# Patient Record
Sex: Female | Born: 1965 | Race: Black or African American | Hispanic: No | Marital: Single | Smoking: Former smoker
Health system: Southern US, Community
[De-identification: ages and names within clinical notes are randomized; demographics above are authoritative.]

## PROBLEM LIST (undated history)

## (undated) DIAGNOSIS — M545 Low back pain, unspecified: Secondary | ICD-10-CM

## (undated) DIAGNOSIS — R200 Anesthesia of skin: Secondary | ICD-10-CM

## (undated) DIAGNOSIS — G709 Myoneural disorder, unspecified: Secondary | ICD-10-CM

## (undated) DIAGNOSIS — F410 Panic disorder [episodic paroxysmal anxiety] without agoraphobia: Secondary | ICD-10-CM

## (undated) DIAGNOSIS — G35D Multiple sclerosis, unspecified: Secondary | ICD-10-CM

## (undated) DIAGNOSIS — F32A Depression, unspecified: Secondary | ICD-10-CM

## (undated) DIAGNOSIS — E7800 Pure hypercholesterolemia, unspecified: Secondary | ICD-10-CM

## (undated) DIAGNOSIS — R011 Cardiac murmur, unspecified: Secondary | ICD-10-CM

## (undated) DIAGNOSIS — R202 Paresthesia of skin: Secondary | ICD-10-CM

## (undated) DIAGNOSIS — I491 Atrial premature depolarization: Secondary | ICD-10-CM

## (undated) DIAGNOSIS — F329 Major depressive disorder, single episode, unspecified: Secondary | ICD-10-CM

## (undated) DIAGNOSIS — G35 Multiple sclerosis: Secondary | ICD-10-CM

## (undated) HISTORY — DX: Depression, unspecified: F32.A

## (undated) HISTORY — DX: Cardiac murmur, unspecified: R01.1

## (undated) HISTORY — DX: Pure hypercholesterolemia, unspecified: E78.00

## (undated) HISTORY — DX: Paresthesia of skin: R20.2

## (undated) HISTORY — DX: Major depressive disorder, single episode, unspecified: F32.9

## (undated) HISTORY — DX: Multiple sclerosis: G35

## (undated) HISTORY — PX: OTHER SURGICAL HISTORY: SHX169

## (undated) HISTORY — DX: Multiple sclerosis, unspecified: G35.D

## (undated) HISTORY — DX: Atrial premature depolarization: I49.1

## (undated) HISTORY — DX: Anesthesia of skin: R20.0

## (undated) HISTORY — DX: Low back pain: M54.5

## (undated) HISTORY — DX: Low back pain, unspecified: M54.50

## (undated) HISTORY — DX: Myoneural disorder, unspecified: G70.9

---

## 1996-10-27 DIAGNOSIS — I491 Atrial premature depolarization: Secondary | ICD-10-CM

## 1996-10-27 HISTORY — DX: Atrial premature depolarization: I49.1

## 1998-09-03 ENCOUNTER — Inpatient Hospital Stay (HOSPITAL_COMMUNITY): Admission: RE | Admit: 1998-09-03 | Discharge: 1998-09-06 | Payer: Self-pay | Admitting: Gynecology

## 2003-01-13 ENCOUNTER — Emergency Department (HOSPITAL_COMMUNITY): Admission: EM | Admit: 2003-01-13 | Discharge: 2003-01-13 | Payer: Self-pay | Admitting: Emergency Medicine

## 2003-05-03 ENCOUNTER — Emergency Department (HOSPITAL_COMMUNITY): Admission: EM | Admit: 2003-05-03 | Discharge: 2003-05-03 | Payer: Self-pay | Admitting: Emergency Medicine

## 2004-05-11 ENCOUNTER — Emergency Department (HOSPITAL_COMMUNITY): Admission: EM | Admit: 2004-05-11 | Discharge: 2004-05-11 | Payer: Self-pay | Admitting: Family Medicine

## 2004-05-13 ENCOUNTER — Emergency Department (HOSPITAL_COMMUNITY): Admission: EM | Admit: 2004-05-13 | Discharge: 2004-05-14 | Payer: Self-pay | Admitting: Family Medicine

## 2004-11-06 ENCOUNTER — Emergency Department (HOSPITAL_COMMUNITY): Admission: EM | Admit: 2004-11-06 | Discharge: 2004-11-06 | Payer: Self-pay | Admitting: Emergency Medicine

## 2004-11-24 ENCOUNTER — Emergency Department (HOSPITAL_COMMUNITY): Admission: EM | Admit: 2004-11-24 | Discharge: 2004-11-24 | Payer: Self-pay | Admitting: Family Medicine

## 2004-12-04 ENCOUNTER — Other Ambulatory Visit: Admission: RE | Admit: 2004-12-04 | Discharge: 2004-12-04 | Payer: Self-pay | Admitting: Family Medicine

## 2005-09-06 ENCOUNTER — Emergency Department (HOSPITAL_COMMUNITY): Admission: EM | Admit: 2005-09-06 | Discharge: 2005-09-06 | Payer: Self-pay | Admitting: Emergency Medicine

## 2005-10-27 HISTORY — PX: ABDOMINAL HYSTERECTOMY: SHX81

## 2006-06-08 ENCOUNTER — Inpatient Hospital Stay (HOSPITAL_COMMUNITY): Admission: RE | Admit: 2006-06-08 | Discharge: 2006-06-11 | Payer: Self-pay | Admitting: Gynecology

## 2006-06-08 ENCOUNTER — Encounter (INDEPENDENT_AMBULATORY_CARE_PROVIDER_SITE_OTHER): Payer: Self-pay | Admitting: *Deleted

## 2008-01-03 ENCOUNTER — Emergency Department (HOSPITAL_COMMUNITY): Admission: EM | Admit: 2008-01-03 | Discharge: 2008-01-03 | Payer: Self-pay | Admitting: Family Medicine

## 2009-08-20 ENCOUNTER — Encounter: Admission: RE | Admit: 2009-08-20 | Discharge: 2009-08-20 | Payer: Self-pay | Admitting: Family Medicine

## 2010-10-15 ENCOUNTER — Encounter
Admission: RE | Admit: 2010-10-15 | Discharge: 2010-10-15 | Payer: Self-pay | Source: Home / Self Care | Attending: Family Medicine | Admitting: Family Medicine

## 2011-03-14 NOTE — H&P (Signed)
[name], [name] NO.:  [id number]   MEDICAL RECORD NO.:  [id number]           PATIENT TYPE:   LOCATION:                                 FACILITY:   PHYSICIAN:  Ivor Costa. Farrel Gobble, M.D.      DATE OF BIRTH:   DATE OF ADMISSION:  06/08/2006  DATE OF DISCHARGE:                                HISTORY & PHYSICAL   CHIEF COMPLAINT:  Symptomatic fibroid uterus.   HISTORY OF PRESENT ILLNESS:  The patient is a 45 year old G1, P1 who  presented to  our office in January of 2007, after not being seen here for a  number of years secondary to a move to [place], [place].  The patient  had been seen by a physician locally there.  At the point when the patient  presented, she had been complaining of dysfunctional bleeding.  She was  known to have a fibroid uterus and had a multiple myomectomy done in 1999.  The patient had not been sexually active.  She states in January she began  bleeding large clots, going through 18-20 maternity pads a day.  She also  reported some fatigue.  The patient had a TSH and prolactin which were  normal.  She had gonorrhea and chlamydia cultures which were similarly  normal.  The patient had a CBC, however, which was remarkable for a  hemoglobin of 4.7 and a hematocrit of 15.8.  Based on that, the patient was  informed and presented back to the office for a sonohystogram.  We did stop  her bleeding prior to the sonohystogram with Megace 40 mg twice a day.  Her  ultrasound showed as many nine fibroids, four or five of which were  appreciable size.  There was nothing, however, impinging upon the cavity  that looked like a polyp or a submucosal myoma.  The patient was kept on  Macrobid twice a day and then once her bleeding stopped, she was decreased  down to Macrobid 40 mg per day and started on iron with extra  supplementation in order to help build up her blood count for surgery.  At  this point, the patient has a rare period which is very  light and painless.  She presents now for definitive surgery.  She had a hemoglobin done on the  third which showed a hemoglobin of 14 and hematocrit of 42 and the patient  was without any complaints.   ALLERGIES:  SHELLFISH.   PAST OBSTETRIC/GYNECOLOGIC HISTORY:  As mentioned above.  The patient also  had a cesarean section in 1997.  She states her Pap smears have been normal.  She is not interested at all in a second conception.   PAST MEDICAL HISTORY:  Negative.   PAST SURGICAL HISTORY:  Cesarean section and myomectomy as mentioned above.   SOCIAL HISTORY:  Negative for alcohol, tobacco or caffeine.   FAMILY HISTORY:  Negative for gynecologic cancers.   PHYSICAL EXAMINATION:  GENERAL APPEARANCE:  She is a well-appearing female  in no acute distress.  LUNGS:  Clear.  CARDIOVASCULAR:  Regular rate.  ABDOMEN:  Obese,  soft and nontender.  GYN:  She has normal external genitalia.  The BUS is negative. The vagina is  pink and moist.  There is some discharge noted in the vault and a wet prep  is taken.  Bimanual exam is no cervical motion tenderness.  The uterus is  mobile and nontender but enlarged.  The adnexa were not palpable.   ASSESSMENT:  Symptomatic fibroid uterus with an episode of severe  menorrhagia that has now been controlled with Megace.  The patient will  remain on Megace up until surgery after which point she may discontinue it.  Hemoglobin and hematocrit are suitable for surgery and she will present now  for a total abdominal hysterectomy.  The patient is aware that there is a  possibility because of scarring, risks to damage and underlying bowel, and  therefore would be bowel prepped the day before.  In addition, she is aware  because of scarring, there is a chance that she may lose one or both of her  adnexa which was also agreeable to her.      Ivor Costa. Farrel Gobble, M.D.  Electronically Signed     THL/MEDQ  D:  05/29/2006  T:  05/29/2006  Job:  [id number]

## 2011-03-14 NOTE — Discharge Summary (Signed)
NAMEJOZLYNN, PLAIA            ACCOUNT NO.:  [id number]   MEDICAL RECORD NO.:  [id number]          PATIENT TYPE:  INP   LOCATION:  9307                          FACILITY:  [facility]   PHYSICIAN:  Ivor Costa. Farrel Gobble, M.D. DATE OF BIRTH:  1965-12-12   DATE OF ADMISSION:  06/08/2006  DATE OF DISCHARGE:  06/11/2006                                 DISCHARGE SUMMARY   PRINCIPAL DIAGNOSIS:  Menorrhagia with severe anemia.   PRINCIPAL PROCEDURE:  Total abdominal hysterectomy.   ADDITIONAL PROCEDURE:  Lysis of bowel adhesions.   NOTE:  Refer to the dictated H and P.   HOSPITAL COURSE:  The patient reported in the morning of 06/08/2006 and  underwent a total abdominal hysterectomy through a Pfannenstiel incision  with an estimated blood loss of approximately 500 mL, with findings of a  multifibroid uterus and a left ovarian cyst that ruptured intraoperatively.  There were bowel adhesions that were noted that were lysed, and there was a  small serosal injury that was repaired.  The patient was extubated in the  operating room and was transferred to the postoperative floor in due  fashion.  Her postoperative course was unremarkable.  Initially, the patient  was kept on clears because of the serosal injury.  However, she had active  bowel sounds on the postoperative check and had flatus by the first morning,  at which point her diet was advanced.  The patient was tolerating a regular  diet, ambulating without difficulty, no nausea or vomiting at the time of  discharge.  The patient was discharged home in stable condition.   POSTOPERATIVE LABORATORY DATA:  Her hemoglobin was 10, hematocrit was 29.1,  platelets 176 and a white count of 8.6.   DISCHARGE INSTRUCTIONS:  The patient was discharged home with instructions  to use over-the-counter Motrin as needed.  She was given Tylox  preoperatively for postoperative pain management.   FOLLOW UP:  She will see [place] back in the office in two  weeks.      Ivor Costa. Farrel Gobble, M.D.  Electronically Signed     THL/MEDQ  D:  06/11/2006  T:  06/11/2006  Job:  [id number]

## 2011-03-14 NOTE — Op Note (Signed)
NAMEBROOKLYNE, RADKE            ACCOUNT NO.:  [id number]   MEDICAL RECORD NO.:  [id number]          PATIENT TYPE:  INP   LOCATION:  9399                          FACILITY:  [facility]   PHYSICIAN:  Ivor Costa. Farrel Gobble, M.D. DATE OF BIRTH:  [birth year]   DATE OF PROCEDURE:  06/08/2006  DATE OF DISCHARGE:                                 OPERATIVE REPORT   PREOPERATIVE DIAGNOSES:  1. Menorrhagia  2. Fibroid uterus.  3. Severe anemia treated.   POSTOPERATIVE DIAGNOSES:  1. Menorrhagia  2. Fibroid uterus.  3. Severe anemia treated.  4. Bowel adhesions.   PROCEDURE:  Total abdominal hysterectomy.   SURGEON:  Ivor Costa. Farrel Gobble, M.D.   ASSISTANT:  Rande Brunt. Eda Paschal, M.D.   ANESTHESIA:  Spinal with some IV sedation.   IV FLUIDS:  1900 mL of lactated Ringer's.   ESTIMATED BLOOD LOSS:  500 mL.   URINE OUTPUT:  250 mL clear urine.   FINDINGS:  Multiple fibroid uterus, there was a small right ovary, clear  ovarian cyst on the left ovary that was ruptured intraoperatively.  There  was small bowel adherent to the underlying rectus muscle.   COMPLICATIONS:  A superficial serosal bowel injury that was repaired.   PATHOLOGY:  Uterus and cervix.   PROCEDURE:  The patient was taken to the operating room.  Spinal anesthesia  was induced and placed in the supine position and once an adequate level was  achieved, Pfannenstiel skin incision was made with the scalpel going through  the previous scar.  The incision was carried through with the Bovie with  careful attention to underlying bleeders.  The fascia was scored and then  extended sharply.  The fascial incision was then grasped with Kochers, the  underlying rectus muscles were dissected off by blunt and sharp dissection,  and they were noted to be markedly scarred to the underlying muscle.  The  muscles were markedly scarred in the midline.  They were elevated with two  Allises bilaterally and gently scored.  The incision, once they were  further  separated, the incision was entered bluntly and then an examining finger was  placed into the peritoneal cavity.  It was felt to be smooth and gently  dissected off anteriorly.  Once incision was slightly dissected off higher,  it was noted that the bowel was markedly adherent.  It was bluntly and  sharply dissected off with some gentle retraction.  The planes were able to  be visualized.  There was a small area of bleeding on the serosa and a small  superficial defect were the bleeding was.  We superficially tagged the  mesentery in this area with Vicryl and left on the wall and tagged in order  for later identification at the end of the procedure.  The peritoneum was  then extended laterally because of the adhesions following the initial  Pfannenstiel incision line.  The bowel was gently packed away and then an  O'Connor-O'Sullivan retractor was placed in the incision.  The uterus was  noted to be bulky, soft, filling the pelvis.  The pedunculated fibroid was  seen on  the right-hand side.  This was grasped and used to rotate the  uterus.  The round ligament was able to be identified and was cauterized  with the Bovie and transected.  The incision was then extended anteriorly  slightly as well as posteriorly.  The ovary and the right-hand side was not  able to be visualized, however, the tube was visualized and was clamped on  the uterine side and similarly a second clamp was placed and sharply  dissected off and a free tie was placed.  The uterus was then rotated to the  left and similarly the round ligament was transected and suture ligated with  0 Vicryl.  The posterior leaf of the broad ligament was similarly incised.  The tubo-ovarian ligament was identified, transected and suture ligated with  0 Vicryl after a free tie was placed.  The bladder began to be dissected  down anteriorly and the uterine vessels were felt to be secured on the left  hand side.  They were clamped and  suture ligated with 0 Vicryl.  The  anterior leaf of the broad ligament was further sharply dissected down in  order to create the bladder flap.  The uterus was rotated.  The uterine  vessels were able to be clamped, however, were markedly cephalad secondary  to a fibroid.  We felt it would be difficult to draw the suture ligature at  this point, however, they were transected.  The specimen was therefore  passed off the field.  There was noted to be some peritoneal reflection  posteriorly on the uterus that was able to be bluntly dissected off with an  examining finger as we were dissecting off the fundal portion.  The small  portion of the ovary was visualized on the right-hand side which was seen to  be in the prior achieved pedicle and ovary residual was seen on the uterus.  The clamp a problem did release slightly with the resection of the fundal  portion, however, the cervical stump was able to be deviated to the left and  clamp was placed medially.  A suture ligature was then placed to incorporate  the cardinal ligament and perhaps the lower portion of the uterus of 0  Vicryl.  The remainder of the uterus was then incorporated in the separate  suture.  The bladder was continued to be sharply and bluntly dissected off  anteriorly.  There was a moderate amount of scarring on the posterior aspect  of the cervix, however, the cardinal ligaments were able to be successfully  transected and suture ligated with 0 Vicryl followed by the uterosacral  vessels.  Once the uterosacrals were crossclamped, the cervical stump was  deviated, elevated and the vagina was entered sharply with careful attention  to the bladder below.  The cervix was then sharply dissected off with an  examining finger in the vagina in order to get the entire cervix and leave  as much vagina, especially posteriorly because of the reflection into the  rectovaginal space.  The cervix was dissected off and inspected prior  to being passed off the table and was intact.  The vagina was then plicated to  the ipsilateral cardinal ligament and where there was bleeding the  rectovaginal space was incorporated into this closure for hemostatic  purposes.  This was done bilaterally.  The vagina was then closed with 0  Vicryl.  The pelvis was then irrigated.  The area on the right cardinal was  inspected and noted  to be hemostatic.  A small area of incidental bleeding  was treated with the Bovie.  The ureter was able to be visualized and  peristalsis was seen.  Her urine remained clear throughout the case.  Similarly we inspected the sidewall on the left.  A small area of bleeding  was treated with a free tie 3-0 Vicryl and was noted to be hemostatic.  The  ureter on that side was also visualized.  The pelvis was irrigated with  copious amounts of warm saline.  The rectovaginal space problem was  basically dry but Surgicel was placed in that area.  The upper blade of the  retractor was removed as were the lap sponges.  The bowel was run until we  reached the Vicryl tag.  There was still some bleeding and serosal damage  which was gently reapproximated with two interrupted 3-0 Vicryl in order to  minimize any stricture in this area.  The remainder of the bowel was  unremarkable.  The bowel remained pink and viable throughout as did the  ovaries.  The peritoneum was closed, however, because of the prior scarring  with 0 Vicryl, the  fascia was also similarly closed with 0 Vicryl.  The subcu was irrigated and  treated where appropriate and then closed with staples.  The patient  tolerated the procedure well.  Sponge, lap and needle counts correct x2.  She was transferred to PACU in stable condition.      Ivor Costa. Farrel Gobble, M.D.  Electronically Signed     THL/MEDQ  D:  06/08/2006  T:  06/08/2006  Job:  [id number]

## 2011-09-12 ENCOUNTER — Other Ambulatory Visit: Payer: Self-pay | Admitting: Family Medicine

## 2011-09-12 DIAGNOSIS — Z1231 Encounter for screening mammogram for malignant neoplasm of breast: Secondary | ICD-10-CM

## 2011-10-17 ENCOUNTER — Ambulatory Visit
Admission: RE | Admit: 2011-10-17 | Discharge: 2011-10-17 | Disposition: A | Discharge status: Home / Self Care | Payer: BC Managed Care – PPO | Source: Ambulatory Visit | Attending: Family Medicine | Admitting: Family Medicine

## 2011-10-17 DIAGNOSIS — Z1231 Encounter for screening mammogram for malignant neoplasm of breast: Secondary | ICD-10-CM

## 2012-05-27 ENCOUNTER — Encounter (HOSPITAL_COMMUNITY): Payer: Self-pay | Admitting: *Deleted

## 2012-05-27 ENCOUNTER — Emergency Department (HOSPITAL_COMMUNITY)
Admission: EM | Admit: 2012-05-27 | Discharge: 2012-05-27 | Disposition: A | Discharge status: Home / Self Care | Payer: Self-pay | Attending: Emergency Medicine | Admitting: Emergency Medicine

## 2012-05-27 ENCOUNTER — Emergency Department (HOSPITAL_COMMUNITY): Payer: Self-pay

## 2012-05-27 DIAGNOSIS — J40 Bronchitis, not specified as acute or chronic: Secondary | ICD-10-CM | POA: Insufficient documentation

## 2012-05-27 DIAGNOSIS — R079 Chest pain, unspecified: Secondary | ICD-10-CM | POA: Insufficient documentation

## 2012-05-27 HISTORY — DX: Panic disorder (episodic paroxysmal anxiety): F41.0

## 2012-05-27 LAB — CBC
Hemoglobin: 12.5 g/dL (ref 12.0–15.0)
MCH: 29.1 pg (ref 26.0–34.0)
MCHC: 33.3 g/dL (ref 30.0–36.0)
Platelets: 243 10*3/uL (ref 150–400)
RBC: 4.29 MIL/uL (ref 3.87–5.11)
RDW: 13.5 % (ref 11.5–15.5)
WBC: 11.8 10*3/uL — ABNORMAL HIGH (ref 4.0–10.5)

## 2012-05-27 LAB — COMPREHENSIVE METABOLIC PANEL
ALT: 10 U/L (ref 0–35)
AST: 15 U/L (ref 0–37)
Albumin: 3.9 g/dL (ref 3.5–5.2)
BUN: 14 mg/dL (ref 6–23)
CO2: 27 mEq/L (ref 19–32)
Calcium: 9.8 mg/dL (ref 8.4–10.5)
Chloride: 104 mEq/L (ref 96–112)
Creatinine, Ser: 1.03 mg/dL (ref 0.50–1.10)
GFR calc Af Amer: 75 mL/min — ABNORMAL LOW (ref >90)
GFR calc non Af Amer: 65 mL/min — ABNORMAL LOW (ref >90)
Glucose, Bld: 108 mg/dL — ABNORMAL HIGH (ref 70–99)
Potassium: 3.4 mEq/L — ABNORMAL LOW (ref 3.5–5.1)
Sodium: 142 mEq/L (ref 135–145)
Total Protein: 7.6 g/dL (ref 6.0–8.3)

## 2012-05-27 LAB — PRO B NATRIURETIC PEPTIDE: Pro B Natriuretic peptide (BNP): 57 pg/mL (ref 0–125)

## 2012-05-27 LAB — POCT I-STAT TROPONIN I

## 2012-05-27 MED ORDER — ALBUTEROL SULFATE HFA 108 (90 BASE) MCG/ACT IN AERS
2.0000 | INHALATION_SPRAY | Freq: Once | RESPIRATORY_TRACT | Status: AC
  Administered 2012-05-27: 2 via RESPIRATORY_TRACT
  Filled 2012-05-27: qty 6.7

## 2012-05-27 NOTE — ED Notes (Signed)
Pt has been feeling "bad", like she had a chest cold, for the last few days.  Tonight around 0200 she felt a burning sensation in her L chest that radiated to her L shoulder.  Pt has had panic attacks in the past, but this does not feel the same.

## 2012-05-27 NOTE — ED Provider Notes (Signed)
History     CSN: [id number]  Arrival date & time 05/27/12  0417   First MD Initiated Contact with Patient 05/27/12 0430      Chief Complaint  Patient presents with  . Chest Pain  . Emesis    (Consider location/radiation/quality/duration/timing/severity/associated sxs/prior treatment) HPI Comments: Patient is a 46 year old female with a history of approximately 3 days of a "chest cold".  She states that she has had a dry nonproductive cough, nasal congestion and a discomfort in her chest when she coughs. This has progressively become worse this evening, it has improved significantly since arrival and currently has only 2/10 chest pain. She denies any ear pain, sore throat, abdominal pain, nausea or vomiting.  Patient is a 46 y.o. female presenting with chest pain and vomiting. The history is provided by the patient and a relative.  Chest Pain Primary symptoms include vomiting.    Emesis     Past Medical History  Diagnosis Date  . Panic attacks     Past Surgical History  Procedure Date  . Cesarian   . Abdominal hysterectomy   . Fibriod removal     No family history on file.  History  Substance Use Topics  . Smoking status: Never Smoker   . Smokeless tobacco: Not on file  . Alcohol Use: Yes     occasionally    OB History    Grav Para Term Preterm Abortions TAB SAB Ect Mult Living                  Review of Systems  Cardiovascular: Positive for chest pain.  Gastrointestinal: Positive for vomiting.  All other systems reviewed and are negative.    Allergies  Review of patient's allergies indicates no known allergies.  Home Medications   Current Outpatient Rx  Name Route Sig Dispense Refill  . ASPIRIN 325 MG PO TABS Oral Take 325 mg by mouth daily as needed. pain    . ADULT MULTIVITAMIN W/MINERALS CH Oral Take 1 tablet by mouth daily.      BP 99/66  Pulse 73  Temp 98.3 F (36.8 C) (Oral)  Resp 22  Ht 5' (1.524 m)  Wt 172 lb (78.019 kg)  BMI  33.59 kg/m2  SpO2 98%  Physical Exam  Nursing note and vitals reviewed. Constitutional: She appears well-developed and well-nourished. No distress.  HENT:  Head: Normocephalic and atraumatic.  Mouth/Throat: Oropharynx is clear and moist. No oropharyngeal exudate.       Mucous membranes are moist, nasal passages are clear with moderate turbinate swelling bilaterally, no significant nasal discharge, tympanic membranes normal bilaterally.  Eyes: Conjunctivae and EOM are normal. Pupils are equal, round, and reactive to light. Right eye exhibits no discharge. Left eye exhibits no discharge. No scleral icterus.  Neck: Normal range of motion. Neck supple. No JVD present. No thyromegaly present.  Cardiovascular: Normal rate, regular rhythm, normal heart sounds and intact distal pulses.  Exam reveals no gallop and no friction rub.   No murmur heard. Pulmonary/Chest: Effort normal and breath sounds normal. No respiratory distress. She has no wheezes. She has no rales.  Abdominal: Soft. Bowel sounds are normal. She exhibits no distension and no mass. There is no tenderness.  Musculoskeletal: Normal range of motion. She exhibits no edema and no tenderness.  Lymphadenopathy:    She has no cervical adenopathy.  Neurological: She is alert. Coordination normal.  Skin: Skin is warm and dry. No rash noted. No erythema.  Psychiatric: She has a  normal mood and affect. Her behavior is normal.    ED Course  Procedures (including critical care time)  Labs Reviewed  CBC - Abnormal; Notable for the following:    WBC 11.8 (*)     All other components within normal limits  COMPREHENSIVE METABOLIC PANEL - Abnormal; Notable for the following:    Potassium 3.4 (*)     Glucose, Bld 108 (*)     Total Bilirubin 0.2 (*)     GFR calc non Af Amer 65 (*)     GFR calc Af Amer 75 (*)     All other components within normal limits  PRO B NATRIURETIC PEPTIDE  POCT I-STAT TROPONIN I   Dg Chest 2 View  05/27/2012   *RADIOLOGY REPORT*  Clinical Data: Mid chest pain, shortness of breath.  CHEST - 2 VIEW  Comparison: 06/18/2007  Findings: Mild hypoaeration results in interstitial and vascular crowding, hemidiaphragm elevation, and mild bibasilar atelectasis. Otherwise, no focal consolidation, pleural effusion, or pneumothorax. Cardiomediastinal contours are within normal range. No acute osseous finding.  IMPRESSION: Allowing for hypoaeration, no radiographic evidence of acute cardiopulmonary process.  Original Report Authenticated By: Waneta Martins, M.D.     1. Bronchitis   2. Chest pain       MDM  EKG is unremarkable and unchanged from prior EKGs, patient has a persistent cough which is nonproductive and has no abnormal lung sounds. Secondary to her chest pain and cough will obtain a chest x-ray, nursing has ordered laboratory work from triage, patient appears stable with normal vital signs otherwise at this time. Blood pressure 105/70 on my exam. The patient denies any risk factors for coronary artery disease and does have a history of panic attacks which gives her similar symptoms to today's symptoms.   ED ECG REPORT  I personally interpreted this EKG   Date: 05/27/2012   Rate: 74  Rhythm: normal sinus rhythm  QRS Axis: normal  Intervals: normal  ST/T Wave abnormalities: nonspecific T wave changes  Conduction Disutrbances:none  Narrative Interpretation: TWA in the anterior and septal leads - unchanged from prior  Old EKG Reviewed: unchanged  Patient reevaluated, is symptom-free at this time, on recheck the lungs are clear without any wheezing rhonchi or rales, chest x-ray reviewed with the patient showing no signs of infiltrates pneumothorax or mediastinal abnormalities. Currently her pulse is normal, oxygen saturations are 100% and the patient is amenable to followup with her family physician. Laboratory data reviewed showing essentially normal electrolytes, slight leukocytosis of 11,800, negative  troponin and a normal BNP which was ordered by nursing. At this time I don't suspect that this is a cardiac source of chest pain but more likely to be related to both anxiety and/or bronchitis as the patient has been having increased coughing over the last several days. She has no peripheral edema, no other significant risk factors for pulmonary embolism, no hypoxia and no tachycardia.  I've explained to her at length the indications for return and she has agreed.  Discharge Prescriptions include:  Albuterol MDI (given prior to d/c to take home)    Vida Roller, MD 05/27/12 [phone]

## 2013-02-01 ENCOUNTER — Other Ambulatory Visit: Payer: Self-pay | Admitting: Family Medicine

## 2013-02-01 DIAGNOSIS — Z1231 Encounter for screening mammogram for malignant neoplasm of breast: Secondary | ICD-10-CM

## 2013-02-25 ENCOUNTER — Ambulatory Visit
Admission: RE | Admit: 2013-02-25 | Discharge: 2013-02-25 | Disposition: A | Discharge status: Home / Self Care | Payer: No Typology Code available for payment source | Source: Ambulatory Visit | Attending: Family Medicine | Admitting: Family Medicine

## 2013-02-25 DIAGNOSIS — Z1231 Encounter for screening mammogram for malignant neoplasm of breast: Secondary | ICD-10-CM

## 2014-07-12 ENCOUNTER — Other Ambulatory Visit: Payer: Self-pay | Admitting: Family Medicine

## 2014-07-12 DIAGNOSIS — Z1231 Encounter for screening mammogram for malignant neoplasm of breast: Secondary | ICD-10-CM

## 2014-07-24 ENCOUNTER — Ambulatory Visit
Admission: RE | Admit: 2014-07-24 | Discharge: 2014-07-24 | Disposition: A | Discharge status: Home / Self Care | Payer: Managed Care, Other (non HMO) | Source: Ambulatory Visit | Attending: Family Medicine | Admitting: Family Medicine

## 2014-07-24 DIAGNOSIS — Z1231 Encounter for screening mammogram for malignant neoplasm of breast: Secondary | ICD-10-CM

## 2015-10-28 DIAGNOSIS — R200 Anesthesia of skin: Secondary | ICD-10-CM

## 2015-10-28 HISTORY — DX: Anesthesia of skin: R20.0

## 2016-02-04 ENCOUNTER — Ambulatory Visit (HOSPITAL_COMMUNITY)
Admission: EM | Admit: 2016-02-04 | Discharge: 2016-02-04 | Disposition: A | Discharge status: Home / Self Care | Payer: 59 | Attending: Emergency Medicine | Admitting: Emergency Medicine

## 2016-02-04 DIAGNOSIS — E538 Deficiency of other specified B group vitamins: Secondary | ICD-10-CM | POA: Insufficient documentation

## 2016-02-04 DIAGNOSIS — G629 Polyneuropathy, unspecified: Secondary | ICD-10-CM | POA: Insufficient documentation

## 2016-02-04 DIAGNOSIS — M7989 Other specified soft tissue disorders: Secondary | ICD-10-CM | POA: Insufficient documentation

## 2016-02-04 DIAGNOSIS — R200 Anesthesia of skin: Secondary | ICD-10-CM | POA: Diagnosis present

## 2016-02-04 DIAGNOSIS — Z9889 Other specified postprocedural states: Secondary | ICD-10-CM | POA: Diagnosis not present

## 2016-02-04 DIAGNOSIS — G6289 Other specified polyneuropathies: Secondary | ICD-10-CM

## 2016-02-04 DIAGNOSIS — F410 Panic disorder [episodic paroxysmal anxiety] without agoraphobia: Secondary | ICD-10-CM | POA: Diagnosis not present

## 2016-02-04 LAB — POCT I-STAT, CHEM 8
BUN: 13 mg/dL (ref 6–20)
Calcium, Ion: 1.19 mmol/L (ref 1.12–1.23)
Chloride: 105 mmol/L (ref 101–111)
Creatinine, Ser: 1 mg/dL (ref 0.44–1.00)
Glucose, Bld: 91 mg/dL (ref 65–99)
HCT: 43 % (ref 36.0–46.0)
HEMOGLOBIN: 14.6 g/dL (ref 12.0–15.0)
Potassium: 4 mmol/L (ref 3.5–5.1)
Sodium: 141 mmol/L (ref 135–145)
TCO2: 25 mmol/L (ref 0–100)

## 2016-02-04 LAB — FOLATE: FOLATE: 18.8 ng/mL (ref >5.9)

## 2016-02-04 LAB — VITAMIN B12: VITAMIN B 12: 576 pg/mL (ref 180–914)

## 2016-02-04 NOTE — ED Provider Notes (Signed)
CSN: [id number]     Arrival date & time 02/04/16  1301 History   First MD Initiated Contact with Patient 02/04/16 1316     Chief Complaint  Patient presents with  . Numbness    in both feet   (Consider location/radiation/quality/duration/timing/severity/associated sxs/prior Treatment) HPI She is a 50 year old woman here for evaluation of numbness in her feet. She denies any history of diabetes, but states that her last physical she was told she was prediabetic. She has been working on cutting out sodas and increasing her exercise. She states about a week and a half ago she developed numbness and a pins and needles sensation in bilateral plantar feet. She states it doesn't involve the great toe bilaterally as well, but not as much. At first, she thought it was due to her work shoes so she changed those, and it seemed to help some. She also states she has had similar symptoms in the past and found to be B-12 deficient. She has restarted over-the-counter B-12 supplements with mild improvement. She does report increased swelling in her legs, particularly after she has been on her feet. No known injury or trauma. She does have an appointment with her PCP on Wednesday.  Past Medical History  Diagnosis Date  . Panic attacks    Past Surgical History  Procedure Laterality Date  . Cesarian    . Abdominal hysterectomy    . Fibriod removal     No family history on file. Social History  Substance Use Topics  . Smoking status: Never Smoker   . Smokeless tobacco: Not on file  . Alcohol Use: Yes     Comment: occasionally   OB History    No data available     Review of Systems As in history of present illness Allergies  Review of patient's allergies indicates no known allergies.  Home Medications   Prior to Admission medications   Medication Sig Start Date End Date Taking? Authorizing Provider  aspirin 325 MG tablet Take 325 mg by mouth daily as needed. pain    Historical Provider, MD   Multiple Vitamin (MULTIVITAMIN WITH MINERALS) TABS Take 1 tablet by mouth daily.    Historical Provider, MD   Meds Ordered and Administered this Visit  Medications - No data to display  BP 121/74 mmHg  Pulse 62  Temp(Src) 98.2 F (36.8 C) (Oral)  SpO2 98% No data found.   Physical Exam  Constitutional: She is oriented to person, place, and time. She appears well-developed and well-nourished. No distress.  Cardiovascular: Normal rate.   Pulmonary/Chest: Effort normal.  Musculoskeletal:  Right foot: Brisk cap refill. No pitting edema. She has dry skin, but no rashes or lesions. She is able to discern sharp from dull about 50% of the time, with problems primarily on the plantar foot. Left foot: Brisk cap refill. No pitting edema. She has dry skin, but no rashes or lesions. She is able to discern sharp from dull 100% of the time.  Neurological: She is alert and oriented to person, place, and time.    ED Course  Procedures (including critical care time)  Labs Review Labs Reviewed  VITAMIN B12  FOLATE  POCT I-STAT, CHEM 8    Imaging Review No results found.    MDM   1. Other polyneuropathy ([place])    I-STAT is normal here. Likely due to footwear versus increased exercise versus vitamin deficiency. B-12 and folate are pending. Reassurance provided. Foot care discussed. Follow-up with PCP as scheduled.  Melony Overly, MD 02/04/16 [phone]

## 2016-02-04 NOTE — Discharge Instructions (Signed)
This is likely coming from poor shoes and low B-12. We will call you with the results of your B-12 and folate levels. This will likely be tomorrow or Wednesday. If you haven't heard from [place] by your appointment, please give [place] a call you can tell your doctor what your numbers were. Keep working on the diet and exercise changes. Check your feet at night to make sure you haven't gotten any cuts or scratches that you missed during the day. If things are getting worse or you develop weakness in your feet, please come back right away.

## 2016-02-04 NOTE — ED Notes (Signed)
Pt stated that she has been having numbness in both feet for a week and a half Pt stated that she has had this issue before and though it was from a vitamin B 12 deficiency Pt stated that she increased her intake of B 12 but the issue still persists Pt alert and oriented

## 2016-02-06 ENCOUNTER — Telehealth (HOSPITAL_COMMUNITY): Payer: Self-pay | Admitting: Emergency Medicine

## 2016-02-06 NOTE — ED Notes (Signed)
LM on pt's VM [phone] Need to give lab results from recent visit on 4/10   Per Dr. Leonor Liv,   Notes Recorded by Melony Overly, MD on 02/04/2016 at 5:38 PM Please notify patient of normal B12 and Folate. She should follow up with her PCP as scheduled for additional evaluation. Notes Recorded by Sherlene Shams, MD on 02/04/2016 at 4:07 PM Please let patient know that B12 and folate levels were normal. Followup foot numbness with pcp/Maura Hamrick as planned this week. LM

## 2016-02-13 NOTE — ED Notes (Signed)
Called pt and notified of recent lab results from visit 4/10 Pt ID'd properly... Reports feeling a little better... Has seen PCP and has been referred to neurologist.   Per Dr. Valere Dross,  Notes Recorded by Melony Overly, MD on 02/04/2016 at 5:38 PM Please notify patient of normal B12 and Folate. She should follow up with her PCP as scheduled for additional evaluation. Notes Recorded by Sherlene Shams, MD on 02/04/2016 at 4:07 PM Please let patient know that B12 and folate levels were normal. Followup foot numbness with pcp/Maura Hamrick as planned this week. LM  Adv pt if sx are not getting better to return  Pt verb understanding

## 2016-02-14 ENCOUNTER — Other Ambulatory Visit: Payer: Self-pay

## 2016-02-14 DIAGNOSIS — Z1231 Encounter for screening mammogram for malignant neoplasm of breast: Secondary | ICD-10-CM

## 2016-02-27 ENCOUNTER — Encounter: Payer: Self-pay | Admitting: Neurology

## 2016-02-27 ENCOUNTER — Ambulatory Visit (INDEPENDENT_AMBULATORY_CARE_PROVIDER_SITE_OTHER): Payer: 59 | Admitting: Neurology

## 2016-02-27 VITALS — BP 146/71 | HR 67 | Ht 60.6 in | Wt 176.0 lb

## 2016-02-27 DIAGNOSIS — R29818 Other symptoms and signs involving the nervous system: Secondary | ICD-10-CM

## 2016-02-27 DIAGNOSIS — R202 Paresthesia of skin: Secondary | ICD-10-CM | POA: Diagnosis not present

## 2016-02-27 DIAGNOSIS — R2689 Other abnormalities of gait and mobility: Secondary | ICD-10-CM | POA: Insufficient documentation

## 2016-02-27 NOTE — Progress Notes (Signed)
PATIENT: [name] DOB: 04-Feb-1966  Chief Complaint  Patient presents with  . Numbness    She has been experiencing constant numbness and tingling in her bilateral feet for the last month.     HISTORICAL  [name] is a 50 years old right-handed female, seen in refer by her primary care physician Dr. Lorin [facility] for evaluation of bilateral feet paresthesia  She was previously healthy, in April 2017, she began to notice bilateral plantar feet numbness, as if her socks rolled up underneath her feet, symmetric, no gait difficulty, no pain  She denies significant low back pain, no neck pain, but about the same time, she noticed mild unbalanced gait, dizziness lightheadedness when change positions suddenly, she denies visual loss, denied bowel and bladder incontinence.  She reported laboratory evaluation in April, but I do not have the report.   REVIEW OF SYSTEMS: Full 14 system review of systems performed and notable only for Numbness, dizziness, shift work, fatigue, anxiety  ALLERGIES: No Known Allergies  HOME MEDICATIONS: Current Outpatient Prescriptions  Medication Sig Dispense Refill  . aspirin 325 MG tablet Take 325 mg by mouth daily as needed. pain    . Multiple Vitamin (MULTIVITAMIN WITH MINERALS) TABS Take 1 tablet by mouth daily.     No current facility-administered medications for this visit.    PAST MEDICAL HISTORY: Past Medical History  Diagnosis Date  . Panic attacks     PAST SURGICAL HISTORY: Past Surgical History  Procedure Laterality Date  . Cesarian    . Abdominal hysterectomy    . Fibriod removal      FAMILY HISTORY: No family history on file.  SOCIAL HISTORY:  Social History   Social History  . Marital Status: Single    Spouse Name: N/A  . Number of Children: N/A  . Years of Education: N/A   Occupational History  . Not on file.   Social History Main Topics  . Smoking status: Never Smoker   . Smokeless tobacco:  Not on file  . Alcohol Use: Yes     Comment: occasionally  . Drug Use: No  . Sexual Activity: Not on file   Other Topics Concern  . Not on file   Social History Narrative     PHYSICAL EXAM   Filed Vitals:   02/27/16 1537  Height: 5' 0.6" (1.539 m)  Weight: 176 lb (79.833 kg)    Not recorded      Body mass index is 33.71 kg/(m^2).  PHYSICAL EXAMNIATION:  Gen: NAD, conversant, well nourised, obese, well groomed                     Cardiovascular: Regular rate rhythm, no peripheral edema, warm, nontender. Eyes: Conjunctivae clear without exudates or hemorrhage Neck: Supple, no carotid bruise. Pulmonary: Clear to auscultation bilaterally   NEUROLOGICAL EXAM:  MENTAL STATUS: Speech:    Speech is normal; fluent and spontaneous with normal comprehension.  Cognition:     Orientation to time, place and person     Normal recent and remote memory     Normal Attention span and concentration     Normal Language, naming, repeating,spontaneous speech     Fund of knowledge   CRANIAL NERVES: CN II: Visual fields are full to confrontation. Fundoscopic exam is normal with sharp discs and no vascular changes. Pupils are round equal and briskly reactive to light. CN III, IV, VI: extraocular movement are normal. No ptosis. CN V: Facial  sensation is intact to pinprick in all 3 divisions bilaterally. Corneal responses are intact.  CN VII: Face is symmetric with normal eye closure and smile. CN VIII: Hearing is normal to rubbing fingers CN IX, X: Palate elevates symmetrically. Phonation is normal. CN XI: Head turning and shoulder shrug are intact CN XII: Tongue is midline with normal movements and no atrophy.  MOTOR: There is no pronator drift of out-stretched arms. Muscle bulk and tone are normal. Muscle strength is normal.  REFLEXES: Reflexes are 3 and symmetric at the biceps, triceps, knees, and ankles. Plantar responses are flexor.  SENSORY: Intact to light touch, pinprick,  positional sensation and vibratory sensation are intact in fingers and toes.  COORDINATION: Rapid alternating movements and fine finger movements are intact. There is no dysmetria on finger-to-nose and heel-knee-shin.    GAIT/STANCE: Posture is normal. Gait is steady with normal steps, base, arm swing, and turning. Heel and toe walking are normal. Tandem gait is normal.  Romberg is absent.   DIAGNOSTIC DATA (LABS, IMAGING, TESTING) - I reviewed patient records, labs, notes, testing and imaging myself where available.   ASSESSMENT AND PLAN  [name] is a 50 y.o. female   Bilateral feet paresthesia, mild unbalanced, hyperreflexia on examination  Differentiation diagnosis includes small fiber neuropathy, remote possibility of cervical spondylitic myelopathy  Proceed with EMG nerve conduction study  Laboratory result from primary care  If there is no etiology found, may proceed with MRI of cervical    Marcial Pacas, M.D. Ph.D.  [facility] [address], [place], [place] [address] Ph: [phone] Fax: [phone]  CC: Leonides Sake, MD

## 2016-03-03 ENCOUNTER — Ambulatory Visit
Admission: RE | Admit: 2016-03-03 | Discharge: 2016-03-03 | Disposition: A | Discharge status: Home / Self Care | Payer: 59 | Source: Ambulatory Visit

## 2016-03-03 DIAGNOSIS — Z1231 Encounter for screening mammogram for malignant neoplasm of breast: Secondary | ICD-10-CM

## 2016-03-18 ENCOUNTER — Ambulatory Visit (INDEPENDENT_AMBULATORY_CARE_PROVIDER_SITE_OTHER): Payer: 59 | Admitting: Neurology

## 2016-03-18 ENCOUNTER — Ambulatory Visit (INDEPENDENT_AMBULATORY_CARE_PROVIDER_SITE_OTHER): Payer: Self-pay | Admitting: Neurology

## 2016-03-18 DIAGNOSIS — R202 Paresthesia of skin: Secondary | ICD-10-CM

## 2016-03-18 DIAGNOSIS — R2689 Other abnormalities of gait and mobility: Secondary | ICD-10-CM

## 2016-03-18 DIAGNOSIS — Z0289 Encounter for other administrative examinations: Secondary | ICD-10-CM

## 2016-03-18 NOTE — Procedures (Signed)
   NCS (NERVE CONDUCTION STUDY) WITH EMG (ELECTROMYOGRAPHY) REPORT   STUDY DATE: Mar 18 2016 PATIENT NAME: LEYSHA DRILL DOB: 1966-06-23 MRN: [id number]    TECHNOLOGIST: Laretta Alstrom ELECTROMYOGRAPHER: Marcial Pacas M.D.  CLINICAL INFORMATION:  50 years old female presented with few months history of bilateral feet paresthesia, mild unsteady gait,  FINDINGS: NERVE CONDUCTION STUDY: Bilateral peroneal sensory responses were normal. Bilateral peroneal to EDB and tibial motor responses were normal. Bilateral tibial H reflexes were normal and symmetric.     NEEDLE ELECTROMYOGRAPHY: Selected needle examinations were performed at bilateral lower extremity muscles bilateral lumbosacral paraspinal muscles.   Needle examination of bilateral tibialis anterior, tibialis posterior, medial gastrocnemius, vastus lateralis was normal.  I also performed a needle examination of right abductor pollicis longus: Normal insertion activity, no spontaneous activity, normal morphology motor unit potential was normal recruitment patterns.  There was no spontaneous activity at bilateral lumbosacral paraspinal muscles bilateral L4-5 S1.  IMPRESSION:   This is a normal study. There is no electrodiagnostic evidence of large fiber peripheral neuropathy or bilateral lumbosacral radiculopathy.    INTERPRETING PHYSICIAN:   Marcial Pacas M.D. Ph.D. [facility] [address] [place],  [id number] [phone]

## 2016-03-18 NOTE — Progress Notes (Signed)
Patient return for electrodiagnostic study today, which is normal, there was no evidence of large fiber peripheral neuropathy

## 2016-03-19 ENCOUNTER — Encounter: Payer: Self-pay | Admitting: *Deleted

## 2016-03-19 ENCOUNTER — Telehealth: Payer: Self-pay | Admitting: Neurology

## 2016-03-19 LAB — SEDIMENTATION RATE: Sed Rate: 11 mm/hr (ref 0–32)

## 2016-03-19 LAB — ANA W/REFLEX IF POSITIVE: Anti Nuclear Antibody(ANA): NEGATIVE

## 2016-03-19 LAB — RPR: RPR: NONREACTIVE

## 2016-03-19 LAB — HGB A1C W/O EAG: Hgb A1c MFr Bld: 5.7 % — ABNORMAL HIGH (ref 4.8–5.6)

## 2016-03-19 LAB — VITAMIN D 25 HYDROXY (VIT D DEFICIENCY, FRACTURES): Vit D, 25-Hydroxy: 9.7 ng/mL — ABNORMAL LOW (ref 30.0–100.0)

## 2016-03-19 LAB — FOLATE: Folate: 9.2 ng/mL (ref >3.0)

## 2016-03-19 LAB — VITAMIN B12: VITAMIN B 12: 315 pg/mL (ref 211–946)

## 2016-03-19 LAB — C-REACTIVE PROTEIN: CRP: 7.4 mg/L — ABNORMAL HIGH (ref 0.0–4.9)

## 2016-03-19 LAB — TSH: TSH: 2.28 u[IU]/mL (ref 0.450–4.500)

## 2016-03-19 NOTE — Telephone Encounter (Signed)
Please call patient, laboratory evaluation showed elevated A1c 5.7, indicating mild elevated glucose level at baseline, she should exercise, diet control, Significantly decreased vitamin D level 9.7, normal should be 30 and above, she should take over-the-counter vitamin D3 supplement, 2000 units every day  Mild elevated C reactive protein of unknown clinical significance, rest of the laboratory evaluation was normal

## 2016-03-19 NOTE — Telephone Encounter (Signed)
Unable to reach patient - left detailed message with her lab results and supplement recommendation on her voicemail (ok per DPR) - left our number to call back with any questions.

## 2016-10-22 ENCOUNTER — Ambulatory Visit (HOSPITAL_COMMUNITY)
Admission: EM | Admit: 2016-10-22 | Discharge: 2016-10-22 | Disposition: A | Discharge status: Home / Self Care | Payer: 59 | Attending: Family Medicine | Admitting: Family Medicine

## 2016-10-22 ENCOUNTER — Encounter (HOSPITAL_COMMUNITY): Payer: Self-pay | Admitting: Emergency Medicine

## 2016-10-22 DIAGNOSIS — K0889 Other specified disorders of teeth and supporting structures: Secondary | ICD-10-CM

## 2016-10-22 MED ORDER — PENICILLIN V POTASSIUM 500 MG PO TABS: 500.0000 mg | ORAL_TABLET | Freq: Three times a day (TID) | ORAL | 0 refills | Status: AC

## 2016-10-22 NOTE — ED Provider Notes (Signed)
CSN: [id number]     Arrival date & time 10/22/16  1557 History   First MD Initiated Contact with Patient 10/22/16 1712     Chief Complaint  Patient presents with  . Dental Pain   (Consider location/radiation/quality/duration/timing/severity/associated sxs/prior Treatment) Patient has a dentist in mind and has the plan to schedule an appointment this week.    The history is provided by the patient.  Dental Pain  Location:  Lower Lower teeth location:  29/RL 2nd bicuspid Quality:  Aching and throbbing Severity:  Moderate Duration:  2 days Timing:  Constant Progression:  Worsening Chronicity:  New Context: dental caries, dental fracture and filling fell out   Relieved by:  NSAIDs (salt water gargle) Worsened by:  Touching Associated symptoms: no facial pain, no facial swelling, no fever, no headaches, no neck pain, no neck swelling and no oral bleeding     Past Medical History:  Diagnosis Date  . Depression   . Hypercholesteremia   . Low back pain   . Numbness and tingling    Feet  . PAC (premature atrial contraction)   . Panic attacks    Past Surgical History:  Procedure Laterality Date  . ABDOMINAL HYSTERECTOMY    . cesarian    . fibriod removal     Family History  Problem Relation Age of Onset  . Seizures Mother   . Arthritis Mother   . Heart disease Maternal Grandfather   . Heart disease Maternal Grandmother    Social History  Substance Use Topics  . Smoking status: Never Smoker  . Smokeless tobacco: Not on file     Comment: Quit 10+ years ago.  . Alcohol use 0.0 oz/week     Comment: occasionally   OB History    No data available     Review of Systems  Constitutional: Negative for fever.       As stated in the HPI  HENT: Negative for facial swelling.   Musculoskeletal: Negative for neck pain.  Neurological: Negative for headaches.    Allergies  Patient has no known allergies.  Home Medications   Prior to Admission medications   Medication  Sig Start Date End Date Taking? Authorizing Provider  aspirin 325 MG tablet Take 325 mg by mouth daily as needed. pain   Yes Historical Provider, MD  escitalopram (LEXAPRO) 10 MG tablet  02/08/16  Yes Historical Provider, MD  penicillin v potassium (VEETID) 500 MG tablet Take 1 tablet (500 mg total) by mouth 3 (three) times daily. 10/22/16 10/29/16  Barry Dienes, NP   Meds Ordered and Administered this Visit  Medications - No data to display  BP (!) 146/48 (BP Location: Left Arm)   Pulse 60   Temp 98 F (36.7 C) (Oral)   Resp 16   SpO2 100%  No data found.   Physical Exam  Constitutional: She is oriented to person, place, and time. She appears well-developed and well-nourished.  HENT:  Head: Normocephalic and atraumatic.  Mouth/Throat:    Right lower 2nd bicuspid noted to have dental fracture with swelling along the gumline and is very tender to palpate.   Cardiovascular: Normal rate and regular rhythm.   Pulmonary/Chest: Effort normal and breath sounds normal. She has no wheezes.  Neurological: She is alert and oriented to person, place, and time.  Skin: Skin is warm and dry.  Nursing note and vitals reviewed.   Urgent Care Course   Clinical Course     Procedures (including critical care time)  Labs Review Labs Reviewed - No data to display  Imaging Review No results found.  MDM   1. Pain, dental    Prescriptions for penicillin given. May continue with ibuprofen at home for pain relief. Reviewed directions for usage and side effects. Patient will call the dental office to schedule an appointment.    Barry Dienes, NP 10/22/16 [phone]

## 2016-10-22 NOTE — Discharge Instructions (Signed)
Take the antibiotic as prescribed. May continue to take ibuprofen for pain relief. Call the dentist and schedule an appointment as soon as possible.

## 2016-10-22 NOTE — ED Triage Notes (Signed)
The patient presented to the [facility] with a complaint of dental pain that started last night.

## 2017-02-26 ENCOUNTER — Encounter: Payer: Self-pay | Admitting: Gastroenterology

## 2017-02-26 ENCOUNTER — Other Ambulatory Visit: Payer: Self-pay | Admitting: Nurse Practitioner

## 2017-02-26 DIAGNOSIS — Z0000 Encounter for general adult medical examination without abnormal findings: Secondary | ICD-10-CM | POA: Diagnosis not present

## 2017-02-26 DIAGNOSIS — Z1231 Encounter for screening mammogram for malignant neoplasm of breast: Secondary | ICD-10-CM

## 2017-03-13 ENCOUNTER — Emergency Department (HOSPITAL_BASED_OUTPATIENT_CLINIC_OR_DEPARTMENT_OTHER)
Admission: EM | Admit: 2017-03-13 | Discharge: 2017-03-13 | Disposition: A | Discharge status: Home / Self Care | Payer: 59 | Attending: Emergency Medicine | Admitting: Emergency Medicine

## 2017-03-13 ENCOUNTER — Emergency Department (HOSPITAL_BASED_OUTPATIENT_CLINIC_OR_DEPARTMENT_OTHER): Payer: 59

## 2017-03-13 ENCOUNTER — Encounter (HOSPITAL_BASED_OUTPATIENT_CLINIC_OR_DEPARTMENT_OTHER): Payer: Self-pay | Admitting: Emergency Medicine

## 2017-03-13 DIAGNOSIS — Y999 Unspecified external cause status: Secondary | ICD-10-CM | POA: Insufficient documentation

## 2017-03-13 DIAGNOSIS — X509XXA Other and unspecified overexertion or strenuous movements or postures, initial encounter: Secondary | ICD-10-CM | POA: Insufficient documentation

## 2017-03-13 DIAGNOSIS — Y929 Unspecified place or not applicable: Secondary | ICD-10-CM | POA: Insufficient documentation

## 2017-03-13 DIAGNOSIS — S63615A Unspecified sprain of left ring finger, initial encounter: Secondary | ICD-10-CM | POA: Diagnosis not present

## 2017-03-13 DIAGNOSIS — S60945A Unspecified superficial injury of left ring finger, initial encounter: Secondary | ICD-10-CM | POA: Diagnosis not present

## 2017-03-13 DIAGNOSIS — Y9301 Activity, walking, marching and hiking: Secondary | ICD-10-CM | POA: Diagnosis not present

## 2017-03-13 DIAGNOSIS — M79642 Pain in left hand: Secondary | ICD-10-CM | POA: Diagnosis not present

## 2017-03-13 MED ORDER — IBUPROFEN 800 MG PO TABS
800.0000 mg | ORAL_TABLET | Freq: Once | ORAL | Status: AC
  Administered 2017-03-13: 800 mg via ORAL
  Filled 2017-03-13: qty 1

## 2017-03-13 MED ORDER — IBUPROFEN 600 MG PO TABS: 600.0000 mg | ORAL_TABLET | Freq: Four times a day (QID) | ORAL | 0 refills | Status: AC | PRN

## 2017-03-13 NOTE — ED Notes (Signed)
Ice applied

## 2017-03-13 NOTE — ED Provider Notes (Signed)
[facility] Provider Note   CSN: [id number] Arrival date & time: 03/13/17  1953  By signing my name below, I, Reola Mosher, attest that this documentation has been prepared under the direction and in the presence of Charlesetta Shanks, MD. Electronically Signed: Reola Mosher, ED Scribe. 03/13/17. 10:53 PM.  History   Chief Complaint Chief Complaint  Patient presents with  . Finger Injury   The history is provided by the patient. No language interpreter was used.    HPI Comments: [name] is a 51 y.o. female who presents to the Emergency Department complaining of sudden onset, persistent right fourth digit pain beginning prior to arrival. Per pt, she was walking her dog this evening on a leash when the dog began running and causing the leash to wrap around and pull the left fourth digit. No falls or other reported injury. Her pain is worse with bending the digit. No treatments for her pain were tried prior to coming into the ED. She denies weakness, numbness, or any other associated symptoms.   Past Medical History:  Diagnosis Date  . Depression   . Hypercholesteremia   . Low back pain   . Numbness and tingling    Feet  . PAC (premature atrial contraction)   . Panic attacks    Patient Active Problem List   Diagnosis Date Noted  . Balance problem 02/27/2016  . Paresthesia 02/27/2016    Past Surgical History:  Procedure Laterality Date  . ABDOMINAL HYSTERECTOMY    . cesarian    . fibriod removal     OB History    No data available     Home Medications    Prior to Admission medications   Medication Sig Start Date End Date Taking? Authorizing Provider  aspirin 325 MG tablet Take 325 mg by mouth daily as needed. pain    [provider]  escitalopram (LEXAPRO) 10 MG tablet  02/08/16   [provider]  ibuprofen (ADVIL,MOTRIN) 600 MG tablet Take 1 tablet (600 mg total) by mouth every 6 (six) hours as needed. 03/13/17    Charlesetta Shanks, MD   Family History Family History  Problem Relation Age of Onset  . Seizures Mother   . Arthritis Mother   . Heart disease Maternal Grandfather   . Heart disease Maternal Grandmother    Social History Social History  Substance Use Topics  . Smoking status: Never Smoker  . Smokeless tobacco: Never Used     Comment: Quit 10+ years ago.  . Alcohol use 0.0 oz/week     Comment: occasionally   Allergies   Patient has no known allergies.  Review of Systems Review of Systems A complete review of systems was obtained and all systems are negative except as noted in the HPI and PMH.   Physical Exam Updated Vital Signs BP 126/63 (BP Location: Right Arm)   Pulse 71   Temp 98.2 F (36.8 C) (Oral)   Resp 18   Ht 5' (1.524 m)   Wt 177 lb (80.3 kg)   SpO2 100%   BMI 34.57 kg/m   Physical Exam  Constitutional: She appears well-developed and well-nourished. No distress.  HENT:  Head: Normocephalic and atraumatic.  Eyes: Conjunctivae are normal.  Neck: Normal range of motion.  Cardiovascular: Normal rate.   Pulmonary/Chest: Effort normal.  Abdominal: She exhibits no distension.  Musculoskeletal: She exhibits tenderness.  No objective swelling or deformity. TTP of the entire fourth digit. Pt can flex and  extend but pain to do against resistance.   Neurological: She is alert.  Skin: No pallor.  Psychiatric: She has a normal mood and affect. Her behavior is normal.  Nursing note and vitals reviewed.  ED Treatments / Results  DIAGNOSTIC STUDIES: Oxygen Saturation is 100% on RA, normal by my interpretation.   COORDINATION OF CARE: 10:40 PM-Discussed next steps with pt. Pt verbalized understanding and is agreeable with the plan.   Labs (all labs ordered are listed, but only abnormal results are displayed) Labs Reviewed - No data to display  EKG  EKG Interpretation None      Radiology Dg Hand Complete Left  Result Date: 03/13/2017 CLINICAL DATA:   Left hand pain and finger pain with swelling after dog leash injury. EXAM: LEFT HAND - COMPLETE 3+ VIEW COMPARISON:  None. FINDINGS: There is no evidence of fracture or dislocation. Mild joint space narrowing of the DIP and PIP joints of the second through fifth digits, interphalangeal joint of the thumb and first MCP. Carpal bones appear intact. Soft tissues are unremarkable. IMPRESSION: Negative for acute fracture nor dislocation. Mild degenerative joint space narrowing of the digits. Electronically Signed   By: Ashley Royalty M.D.   On: 03/13/2017 20:39   Procedures Procedures   Medications Ordered in ED Medications  ibuprofen (ADVIL,MOTRIN) tablet 800 mg (not administered)    Initial Impression / Assessment and Plan / ED Course  I have reviewed the triage vital signs and the nursing notes.  Pertinent labs & imaging results that were available during my care of the patient were reviewed by me and considered in my medical decision making (see chart for details).     Final Clinical Impressions(s) / ED Diagnoses   Final diagnoses:  Sprain of left ring finger, unspecified site of finger, initial encounter  Patient has tenderness to palpation generally of the left fourth digit. Deformity or distinct swelling. She can flex and extend the digit although she finds it painful. At this time she will placed in buddy tape and splinting with recommend a follow-up with PCP in 3-4 days. New Prescriptions New Prescriptions   IBUPROFEN (ADVIL,MOTRIN) 600 MG TABLET    Take 1 tablet (600 mg total) by mouth every 6 (six) hours as needed.       Charlesetta Shanks, MD 03/13/17 2255

## 2017-03-13 NOTE — ED Triage Notes (Signed)
Patient states that she was walking her dog and got her left hand caught in the leash. Pain to her left pinky and hand

## 2017-03-20 ENCOUNTER — Ambulatory Visit
Admission: RE | Admit: 2017-03-20 | Discharge: 2017-03-20 | Disposition: A | Discharge status: Home / Self Care | Payer: 59 | Source: Ambulatory Visit | Attending: Nurse Practitioner | Admitting: Nurse Practitioner

## 2017-03-20 DIAGNOSIS — Z1231 Encounter for screening mammogram for malignant neoplasm of breast: Secondary | ICD-10-CM

## 2017-04-28 ENCOUNTER — Ambulatory Visit (AMBULATORY_SURGERY_CENTER): Payer: Self-pay

## 2017-04-28 VITALS — Ht 60.0 in | Wt 176.2 lb

## 2017-04-28 DIAGNOSIS — Z1211 Encounter for screening for malignant neoplasm of colon: Secondary | ICD-10-CM

## 2017-04-28 MED ORDER — NA SULFATE-K SULFATE-MG SULF 17.5-3.13-1.6 GM/177ML PO SOLN: ORAL | 0 refills | Status: DC

## 2017-04-28 NOTE — Progress Notes (Signed)
Per pt, no allergies to soy or egg products.Pt not taking any weight loss meds or using  O2 at home.   Emmi video sent to email. 

## 2017-04-30 ENCOUNTER — Telehealth: Payer: Self-pay | Admitting: Gastroenterology

## 2017-04-30 NOTE — Telephone Encounter (Signed)
miralax instructions printed and faxed to pharmacy [facility]

## 2017-05-04 ENCOUNTER — Encounter: Payer: Self-pay | Admitting: Gastroenterology

## 2017-05-12 ENCOUNTER — Encounter: Payer: 59 | Admitting: Gastroenterology

## 2017-12-03 DIAGNOSIS — H16223 Keratoconjunctivitis sicca, not specified as Sjogren's, bilateral: Secondary | ICD-10-CM | POA: Diagnosis not present

## 2017-12-03 DIAGNOSIS — H16142 Punctate keratitis, left eye: Secondary | ICD-10-CM | POA: Diagnosis not present

## 2017-12-16 DIAGNOSIS — F419 Anxiety disorder, unspecified: Secondary | ICD-10-CM | POA: Insufficient documentation

## 2017-12-16 DIAGNOSIS — L03019 Cellulitis of unspecified finger: Secondary | ICD-10-CM | POA: Diagnosis not present

## 2017-12-17 DIAGNOSIS — L03019 Cellulitis of unspecified finger: Secondary | ICD-10-CM | POA: Diagnosis not present

## 2018-05-28 DIAGNOSIS — Z0000 Encounter for general adult medical examination without abnormal findings: Secondary | ICD-10-CM | POA: Diagnosis not present

## 2018-05-28 DIAGNOSIS — Z1231 Encounter for screening mammogram for malignant neoplasm of breast: Secondary | ICD-10-CM | POA: Diagnosis not present

## 2018-06-01 ENCOUNTER — Other Ambulatory Visit: Payer: Self-pay | Admitting: Nurse Practitioner

## 2018-06-01 DIAGNOSIS — Z1231 Encounter for screening mammogram for malignant neoplasm of breast: Secondary | ICD-10-CM

## 2018-07-12 ENCOUNTER — Ambulatory Visit
Admission: RE | Admit: 2018-07-12 | Discharge: 2018-07-12 | Disposition: A | Discharge status: Home / Self Care | Payer: 59 | Source: Ambulatory Visit | Attending: Nurse Practitioner | Admitting: Nurse Practitioner

## 2018-07-12 DIAGNOSIS — Z1231 Encounter for screening mammogram for malignant neoplasm of breast: Secondary | ICD-10-CM | POA: Diagnosis not present

## 2019-02-02 DIAGNOSIS — R202 Paresthesia of skin: Secondary | ICD-10-CM | POA: Diagnosis not present

## 2019-02-02 DIAGNOSIS — M545 Low back pain: Secondary | ICD-10-CM | POA: Diagnosis not present

## 2019-02-02 DIAGNOSIS — Z6833 Body mass index (BMI) 33.0-33.9, adult: Secondary | ICD-10-CM | POA: Diagnosis not present

## 2019-07-26 ENCOUNTER — Other Ambulatory Visit: Payer: Self-pay | Admitting: Nurse Practitioner

## 2019-07-26 DIAGNOSIS — Z1231 Encounter for screening mammogram for malignant neoplasm of breast: Secondary | ICD-10-CM

## 2019-08-16 ENCOUNTER — Ambulatory Visit: Payer: 59

## 2019-12-27 ENCOUNTER — Other Ambulatory Visit: Payer: Self-pay | Admitting: Family Medicine

## 2019-12-27 DIAGNOSIS — Z1231 Encounter for screening mammogram for malignant neoplasm of breast: Secondary | ICD-10-CM

## 2020-01-11 ENCOUNTER — Other Ambulatory Visit: Payer: Self-pay | Admitting: Family Medicine

## 2020-01-11 DIAGNOSIS — M5412 Radiculopathy, cervical region: Secondary | ICD-10-CM

## 2020-01-30 ENCOUNTER — Ambulatory Visit
Admission: RE | Admit: 2020-01-30 | Discharge: 2020-01-30 | Disposition: A | Discharge status: Home / Self Care | Payer: 59 | Source: Ambulatory Visit | Attending: Family Medicine | Admitting: Family Medicine

## 2020-01-30 ENCOUNTER — Other Ambulatory Visit: Payer: Self-pay

## 2020-01-30 DIAGNOSIS — Z1231 Encounter for screening mammogram for malignant neoplasm of breast: Secondary | ICD-10-CM

## 2020-02-04 ENCOUNTER — Other Ambulatory Visit: Payer: 59

## 2020-02-09 ENCOUNTER — Other Ambulatory Visit (HOSPITAL_COMMUNITY): Payer: Self-pay | Admitting: Family Medicine

## 2020-02-09 DIAGNOSIS — R011 Cardiac murmur, unspecified: Secondary | ICD-10-CM

## 2020-02-10 ENCOUNTER — Other Ambulatory Visit (HOSPITAL_COMMUNITY): Payer: Self-pay | Admitting: Family Medicine

## 2020-02-10 DIAGNOSIS — R600 Localized edema: Secondary | ICD-10-CM

## 2020-02-13 ENCOUNTER — Ambulatory Visit (HOSPITAL_COMMUNITY): Payer: 59

## 2020-02-13 ENCOUNTER — Ambulatory Visit (HOSPITAL_COMMUNITY)
Admission: RE | Admit: 2020-02-13 | Discharge: 2020-02-13 | Disposition: A | Discharge status: Home / Self Care | Payer: 59 | Source: Ambulatory Visit | Attending: Family Medicine | Admitting: Family Medicine

## 2020-02-13 ENCOUNTER — Other Ambulatory Visit: Payer: Self-pay

## 2020-02-13 ENCOUNTER — Ambulatory Visit (HOSPITAL_COMMUNITY): Admission: RE | Admit: 2020-02-13 | Payer: 59 | Source: Ambulatory Visit

## 2020-02-13 DIAGNOSIS — R600 Localized edema: Secondary | ICD-10-CM | POA: Diagnosis not present

## 2020-02-13 NOTE — Progress Notes (Signed)
Bilateral lower extremity venous duplex completed. Refer to "CV Proc" under chart review to view preliminary results.  02/13/2020 3:45 PM Kelby Aline., MHA, RVT, RDCS, RDMS

## 2020-02-25 ENCOUNTER — Ambulatory Visit
Admission: RE | Admit: 2020-02-25 | Discharge: 2020-02-25 | Disposition: A | Discharge status: Home / Self Care | Payer: 59 | Source: Ambulatory Visit | Attending: Family Medicine | Admitting: Family Medicine

## 2020-02-25 ENCOUNTER — Other Ambulatory Visit: Payer: Self-pay

## 2020-02-25 DIAGNOSIS — M5412 Radiculopathy, cervical region: Secondary | ICD-10-CM

## 2020-03-01 ENCOUNTER — Other Ambulatory Visit: Payer: Self-pay | Admitting: Family Medicine

## 2020-03-02 ENCOUNTER — Other Ambulatory Visit: Payer: Self-pay | Admitting: Family Medicine

## 2020-03-02 DIAGNOSIS — G939 Disorder of brain, unspecified: Secondary | ICD-10-CM

## 2020-03-31 ENCOUNTER — Ambulatory Visit
Admission: RE | Admit: 2020-03-31 | Discharge: 2020-03-31 | Disposition: A | Discharge status: Home / Self Care | Payer: 59 | Source: Ambulatory Visit | Attending: Family Medicine | Admitting: Family Medicine

## 2020-03-31 ENCOUNTER — Other Ambulatory Visit: Payer: Self-pay

## 2020-03-31 DIAGNOSIS — G939 Disorder of brain, unspecified: Secondary | ICD-10-CM

## 2020-03-31 MED ORDER — GADOBENATE DIMEGLUMINE 529 MG/ML IV SOLN
16.0000 mL | Freq: Once | INTRAVENOUS | Status: AC | PRN
  Administered 2020-03-31: 16 mL via INTRAVENOUS

## 2020-04-09 ENCOUNTER — Encounter: Payer: Self-pay | Admitting: Neurology

## 2020-04-27 ENCOUNTER — Ambulatory Visit: Payer: 59 | Admitting: Neurology

## 2020-05-10 NOTE — Progress Notes (Addendum)
NEUROLOGY CONSULTATION NOTE  [name] MRN: [id number] DOB: 1966/04/16  Referring provider: Daiva Eves, MD Primary care provider: [place]  Reason for consult:  Demyelinating lesion  HISTORY OF PRESENT ILLNESS: [name] is a 54 year old right-handed female who presents for demyelinating lesion.  History supplemented by prior neurologist's and referring provider's notes.  She began having some balance problems and vision problems when she was in her early 62s.  In 2017, she began experiencing bilateral foot numbness and tingling as well as balance problems.  She saw neurology and had a NCV-EMG on 03/18/2016 which was normal.  Labs demonstrated negative ANA, sed rate 11, elevated CRP 7.4, B12 315, folate 9.2, non-reactive RPR, TSH 2.280, Hgb A1c 5.7 and low vit D 9.7.  She couldn't tolerate gabapentin  In March 2021, she began having left periorbital eye pain, not with movement, but aggravated when rubbing the eye.  She also notes blurred vision in the left eye.  She saw the eye doctor and was found only to have dry eye.    Since 2020 she has had left sided neck pain that radiates down the left arm with numbness in the fingers, aggravated with certain body movements.  No weakness.  She had an MRI of the cervical spine without contrast on 02/25/2020 which demonstrated multiple hyperintense lesions throughout the cervical and upper thoracic cord.  Follow up MRI of brain with and without contrast on 03/31/2020 showed multiple T2 hyperintense lesions involving the periventricular, subcortical and juxtacortical white matter with involvement of the callososeptal interface and brainstem, no enhancement.    No family history of MS.  However, her mother has RA.  She currently takes: D3 500 IU B12 1023mg  PAST MEDICAL HISTORY: Past Medical History:  Diagnosis Date  . Depression   . Heart murmur    as a child  . Hypercholesteremia   . Low back pain     . Numbness and tingling 2017   Feet  . PAC (premature atrial contraction) 1998  . Panic attacks     PAST SURGICAL HISTORY: Past Surgical History:  Procedure Laterality Date  . ABDOMINAL HYSTERECTOMY  2007  . cesarian     1 time  . fibriod removal     uterine    MEDICATIONS: Current Outpatient Medications on File Prior to Visit  Medication Sig Dispense Refill  . aspirin 325 MG tablet Take 325 mg by mouth daily as needed. pain    . escitalopram (LEXAPRO) 10 MG tablet 10 mg daily.     .Marland Kitchenibuprofen (ADVIL,MOTRIN) 600 MG tablet Take 1 tablet (600 mg total) by mouth every 6 (six) hours as needed. (Patient taking differently: Take 600 mg by mouth as needed. ) 30 tablet 0  . Na Sulfate-K Sulfate-Mg Sulf (SUPREP BOWEL PREP KIT) 17.5-3.13-1.6 GM/180ML SOLN Suprep as directed / no substitutions 354 mL 0   No current facility-administered medications on file prior to visit.    ALLERGIES: No Known Allergies  FAMILY HISTORY: Family History  Problem Relation Age of Onset  . Seizures Mother   . Arthritis Mother   . Heart disease Maternal Grandfather   . Heart disease Maternal Grandmother   . Hypertension Sister   . COPD Brother     SOCIAL HISTORY: Social History   Socioeconomic History  . Marital status: Single    Spouse name: Not on file  . Number of children: 1  . Years of education: College  . Highest education level:  Not on file  Occupational History  . Occupation: Tree surgeon  Tobacco Use  . Smoking status: Former Research scientist (life sciences)  . Smokeless tobacco: Never Used  . Tobacco comment: Quit 10+ years ago.  Substance and Sexual Activity  . Alcohol use: Yes    Alcohol/week: 0.0 standard drinks    Comment: occasionally  . Drug use: No  . Sexual activity: Not on file  Other Topics Concern  . Not on file  Social History Narrative   Lives at home with son.   Right-handed.   1 soft drink per day.   Social Determinants of Health   Financial Resource Strain:   .  Difficulty of Paying Living Expenses:   Food Insecurity:   . Worried About Charity fundraiser in the Last Year:   . Arboriculturist in the Last Year:   Transportation Needs:   . Film/video editor (Medical):   Marland Kitchen Lack of Transportation (Non-Medical):   Physical Activity:   . Days of Exercise per Week:   . Minutes of Exercise per Session:   Stress:   . Feeling of Stress :   Social Connections:   . Frequency of Communication with Friends and Family:   . Frequency of Social Gatherings with Friends and Family:   . Attends Religious Services:   . Active Member of Clubs or Organizations:   . Attends Archivist Meetings:   Marland Kitchen Marital Status:   Intimate Partner Violence:   . Fear of Current or Ex-Partner:   . Emotionally Abused:   Marland Kitchen Physically Abused:   . Sexually Abused:     PHYSICAL EXAM: Blood pressure 125/75, pulse 77, resp. rate 18, height 5' (1.524 m), weight 179 lb (81.2 kg), SpO2 99 %. General: No acute distress.  Patient appears well-groomed.   Head:  Normocephalic/atraumatic Eyes:  fundi examined but not visualized Neck: supple, no paraspinal tenderness, full range of motion Back: No paraspinal tenderness Heart: regular rate and rhythm Lungs: Clear to auscultation bilaterally. Vascular: No carotid bruits. Neurological Exam: Mental status: alert and oriented to person, place, and time, recent and remote memory intact, fund of knowledge intact, attention and concentration intact, speech fluent and not dysarthric, language intact. Cranial nerves: CN I: not tested CN II: pupils equal, round and reactive to light, visual fields intact CN III, IV, VI:  full range of motion, no nystagmus, no ptosis CN V: facial sensation intact CN VII: upper and lower face symmetric CN VIII: hearing intact CN IX, X: gag intact, uvula midline CN XI: sternocleidomastoid and trapezius muscles intact CN XII: tongue midline Bulk & Tone: normal, no fasciculations. Motor:  5-/5 right  hip flexion.  Otherwise, 5/5 throughout  Sensation:  Pinprick and vibration sensation intact.  Deep Tendon Reflexes:  3+ throughout with nonsustained clonus in ankles, toes downgoing.   Finger to nose testing:  Without dysmetria.   Heel to shin:  Without dysmetria.   Gait:  Mildly wide-based gait.  Romberg with sway.  IMPRESSION: Multiple sclerosis  PLAN: Given the extensive cervical spinal cord involvement, I would favor an infusion.  If JC Virus negative or with low index, then start Tysabri.  Otherwise, we will try Ocrevus.  1.  We will check CBC with diff, CMP, vitamin D, NMO antibodies, JC Virus antibody with Index, hepatitis B panel, IgG/IgA/IgM 2.  Follow up in 6 months.   Thank you for allowing me to take part in the care of this patient.  Metta Clines, DO  CC:  Daiva Eves, MD

## 2020-05-11 ENCOUNTER — Other Ambulatory Visit: Payer: Self-pay

## 2020-05-11 ENCOUNTER — Encounter: Payer: Self-pay | Admitting: Neurology

## 2020-05-11 ENCOUNTER — Other Ambulatory Visit (INDEPENDENT_AMBULATORY_CARE_PROVIDER_SITE_OTHER): Payer: 59

## 2020-05-11 ENCOUNTER — Ambulatory Visit (INDEPENDENT_AMBULATORY_CARE_PROVIDER_SITE_OTHER): Payer: 59 | Admitting: Neurology

## 2020-05-11 VITALS — BP 125/75 | HR 77 | Resp 18 | Ht 60.0 in | Wt 179.0 lb

## 2020-05-11 DIAGNOSIS — G35 Multiple sclerosis: Secondary | ICD-10-CM | POA: Diagnosis not present

## 2020-05-11 LAB — CBC WITH DIFFERENTIAL/PLATELET
Basophils Absolute: 0 10*3/uL (ref 0.0–0.1)
Basophils Relative: 0.6 % (ref 0.0–3.0)
Eosinophils Absolute: 0.2 10*3/uL (ref 0.0–0.7)
Eosinophils Relative: 2.7 % (ref 0.0–5.0)
HCT: 37 % (ref 36.0–46.0)
Hemoglobin: 12.2 g/dL (ref 12.0–15.0)
Lymphocytes Relative: 34.2 % (ref 12.0–46.0)
Lymphs Abs: 2.8 10*3/uL (ref 0.7–4.0)
MCHC: 33 g/dL (ref 30.0–36.0)
MCV: 89.5 fl (ref 78.0–100.0)
Monocytes Absolute: 0.5 10*3/uL (ref 0.1–1.0)
Monocytes Relative: 6 % (ref 3.0–12.0)
Neutro Abs: 4.6 10*3/uL (ref 1.4–7.7)
Neutrophils Relative %: 56.5 % (ref 43.0–77.0)
Platelets: 234 10*3/uL (ref 150.0–400.0)
RBC: 4.13 Mil/uL (ref 3.87–5.11)
RDW: 14.3 % (ref 11.5–15.5)
WBC: 8.1 10*3/uL (ref 4.0–10.5)

## 2020-05-11 LAB — VITAMIN D 25 HYDROXY (VIT D DEFICIENCY, FRACTURES): VITD: 40.94 ng/mL (ref 30.00–100.00)

## 2020-05-11 NOTE — Patient Instructions (Signed)
1.  We will check CBC with diff, CMP, vitamin D, NMO antibodies, JC Virus antibody with Index, hepatitis B panel, IgG/IgA/IgM 2.  If labs look okay, plan is to start Tysabri.  Otherwise, I would like to start [place].   Multiple Sclerosis Multiple sclerosis (MS) is a disease of the brain, spinal cord, and optic nerves (central nervous system). It causes the body's disease-fighting (immune) system to destroy the protective covering (myelin sheath) around nerves in the brain. When this happens, signals (nerve impulses) going to and from the brain and spinal cord do not get sent properly or may not get sent at all. There are several types of MS:  Relapsing-remitting MS. This is the most common type. This causes sudden attacks of symptoms. After an attack, you may recover completely until the next attack, or some symptoms may remain permanently.  Secondary progressive MS. This usually develops after the onset of relapsing-remitting MS. Similar to relapsing-remitting MS, this type also causes sudden attacks of symptoms. Attacks may be less frequent, but symptoms slowly get worse (progress) over time.  Primary progressive MS. This causes symptoms that steadily progress over time. This type of MS does not cause sudden attacks of symptoms. The age of onset of MS varies, but it often develops between 42-82 years of age. MS is a lifelong (chronic) condition. There is no cure, but treatment can help slow down the progression of the disease. What are the causes? The cause of this condition is not known. What increases the risk? You are more likely to develop this condition if:  You are a woman.  You have a relative with MS. However, the condition is not passed from parent to child (inherited).  You have a lack (deficiency) of vitamin D.  You smoke. MS is more common in the [place] than in the [place]. What are the signs or symptoms? Relapsing-remitting and secondary  progressive MS cause symptoms to occur in episodes or attacks that may last weeks to months. There may be long periods between attacks in which there are almost no symptoms. Primary progressive MS causes symptoms to steadily progress after they develop. Symptoms of MS vary because of the many different ways it affects the central nervous system. The main symptoms include:  Vision problems and eye pain.  Numbness.  Weakness.  Inability to move your arms, hands, feet, or legs (paralysis).  Balance problems.  Shaking that you cannot control (tremors).  Muscle spasms.  Problems with thinking (cognitive changes). MS can also cause symptoms that are associated with the disease, but are not always the direct result of an MS attack. They may include:  Inability to control urination or bowel movements (incontinence).  Headaches.  Fatigue.  Inability to tolerate heat.  Emotional changes.  Depression.  Pain. How is this diagnosed? This condition is diagnosed based on:  Your symptoms.  A neurological exam. This involves checking central nervous system function, such as nerve function, reflexes, and coordination.  MRIs of the brain and spinal cord.  Lab tests, including a lumbar puncture that tests the fluid that surrounds the brain and spinal cord (cerebrospinal fluid).  Tests to measure the electrical activity of the brain in response to stimulation (evoked potentials). How is this treated? There is no cure for MS, but medicines can help decrease the number and frequency of attacks and help relieve nuisance symptoms. Treatment options may include:  Medicines that reduce the frequency of attacks. These medicines may be given by injection,  by mouth (orally), or through an IV.  Medicines that reduce inflammation (steroids). These may provide short-term relief of symptoms.  Medicines to help control pain, depression, fatigue, or incontinence.  Vitamin D, if you have a  deficiency.  Using devices to help you move around (assistive devices), such as braces, a cane, or a walker.  Physical therapy to strengthen and stretch your muscles.  Occupational therapy to help you with everyday tasks.  Alternative or complementary treatments such as exercise, massage, or acupuncture. Follow these instructions at home:  Take over-the-counter and prescription medicines only as told by your health care provider.  Do not drive or use heavy machinery while taking prescription pain medicine.  Use assistive devices as recommended by your physical therapist or your health care provider.  Exercise as directed by your health care provider.  Return to your normal activities as told by your health care provider. Ask your health care provider what activities are safe for you.  Reach out for support. Share your feelings with friends, family, or a support group.  Keep all follow-up visits as told by your health care provider and therapists. This is important. Where to find more information  National Multiple Sclerosis Society: https://www.nationalmssociety.org Contact a health care provider if:  You feel depressed.  You develop new pain or numbness.  You have tremors.  You have problems with sexual function. Get help right away if:  You develop paralysis.  You develop numbness.  You have problems with your bladder or bowel function.  You develop double vision.  You lose vision in one or both eyes.  You develop suicidal thoughts.  You develop severe confusion. If you ever feel like you may hurt yourself or others, or have thoughts about taking your own life, get help right away. You can go to your nearest emergency department or call:  Your local emergency services (911 in the [place]  A suicide crisis helpline, such as the [facility] at 910-631-9247. This is open 24 hours a day. Summary  Multiple sclerosis (MS) is a disease of  the central nervous system that causes the body's immune system to destroy the protective covering (myelin sheath) around nerves in the brain.  There are 3 types of MS: relapsing-remitting, secondary progressive, and primary progressive. Relapsing-remitting and secondary progressive MS cause symptoms to occur in episodes or attacks that may last weeks to months. Primary progressive MS causes symptoms to steadily progress after they develop.  There is no cure for MS, but medicines can help decrease the number and frequency of attacks and help relieve nuisance symptoms. Treatment may also include physical or occupational therapy.  If you develop numbness, paralysis, vision problems, or other neurological symptoms, get help right away. This information is not intended to replace advice given to you by your health care provider. Make sure you discuss any questions you have with your health care provider. Document Revised: 09/25/2017 Document Reviewed: 12/22/2016 Elsevier Patient Education  2020 [employer].

## 2020-05-14 LAB — IGG, IGA, IGM
IgG (Immunoglobin G), Serum: 1281 mg/dL (ref 600–1640)
IgM, Serum: 38 mg/dL — ABNORMAL LOW (ref 50–300)
Immunoglobulin A: 148 mg/dL (ref 47–310)

## 2020-05-14 LAB — ACUTE HEP PANEL AND HEP B SURFACE AB
HEPATITIS C ANTIBODY REFILL$(REFL): NONREACTIVE
Hep A IgM: NONREACTIVE
Hep B C IgM: NONREACTIVE
Hepatitis B Surface Ag: NONREACTIVE
SIGNAL TO CUT-OFF: 0.08 (ref <1.00)

## 2020-05-14 LAB — REFLEX TIQ

## 2020-05-18 LAB — NEUROMYELITIS OPTICA AUTOAB, IGG: NMO IgG Autoantibodies: <1.5 U/mL (ref 0.0–3.0)

## 2020-05-18 LAB — STRATIFY JCV(TM) AB W/INDEX
JCV Antibody: POSITIVE — AB
JCV Index Value: 0.42

## 2020-05-22 ENCOUNTER — Telehealth: Payer: Self-pay | Admitting: Neurology

## 2020-05-22 NOTE — Telephone Encounter (Signed)
Telephone call to pt, need to know if pt want to do in office or in home infusion. Please give [place] a call back.

## 2020-05-22 NOTE — Telephone Encounter (Signed)
I called [name] to discuss lab results. She is JC Virus positive but with a low index (0.4).  Despite that, she would rather start Ocrevus instead of Tysabri.  IgM level is low but not too significant (38) and low IgM is typically less likely to cause increased risk of infections.  We will initiate Ocrevus.  I want to recheck quantitative immunoglobulin panel in 6 months.  Her vitamin D level was 40.  I advised her to increase D3 to 2000 IU daily.

## 2020-05-23 ENCOUNTER — Other Ambulatory Visit: Payer: Self-pay

## 2020-05-23 DIAGNOSIS — G35 Multiple sclerosis: Secondary | ICD-10-CM

## 2020-05-23 NOTE — Telephone Encounter (Signed)
quantitative immunoglobulin order added for 6 months out.

## 2020-06-04 NOTE — Progress Notes (Signed)
Ocrevus approved for 05/25/20-1//30/22 For [place]

## 2020-06-08 NOTE — Progress Notes (Signed)
[place] fax over pt update for Ocrevus: 06/07/20 infusion date:  Pt experienced some itching and HA during infusion. ADR Protocol followed. Pt completed infusion therapy and symptoms resolved after intervention.   Next infusion date 06/21/20 at 8:30 am

## 2020-07-03 ENCOUNTER — Ambulatory Visit: Payer: 59 | Admitting: Neurology

## 2020-07-09 ENCOUNTER — Ambulatory Visit
Admission: EM | Admit: 2020-07-09 | Discharge: 2020-07-09 | Disposition: A | Discharge status: Home / Self Care | Payer: 59 | Attending: Physician Assistant | Admitting: Physician Assistant

## 2020-07-09 DIAGNOSIS — Z1152 Encounter for screening for COVID-19: Secondary | ICD-10-CM | POA: Diagnosis not present

## 2020-07-09 NOTE — Discharge Instructions (Signed)

## 2020-07-10 LAB — SARS-COV-2, NAA 2 DAY TAT

## 2020-07-10 LAB — NOVEL CORONAVIRUS, NAA: SARS-CoV-2, NAA: NOT DETECTED

## 2020-08-13 ENCOUNTER — Encounter: Payer: Self-pay | Admitting: Gastroenterology

## 2020-10-01 ENCOUNTER — Ambulatory Visit (AMBULATORY_SURGERY_CENTER): Payer: Self-pay

## 2020-10-01 ENCOUNTER — Other Ambulatory Visit: Payer: Self-pay

## 2020-10-01 VITALS — Ht 60.0 in | Wt 184.0 lb

## 2020-10-01 DIAGNOSIS — Z1211 Encounter for screening for malignant neoplasm of colon: Secondary | ICD-10-CM

## 2020-10-01 NOTE — Progress Notes (Signed)
No allergies to soy or egg Pt is not on blood thinners or diet pills Denies issues with sedation/intubation Denies atrial flutter/fib Has Constipation -- prep change to a 2 day prep  Emmi instructions given to pt  Pt is aware of Covid safety and care partner requirements.   Next dose of Ocrevus is in Jan 2022.

## 2020-10-02 ENCOUNTER — Encounter: Payer: Self-pay | Admitting: Gastroenterology

## 2020-10-10 ENCOUNTER — Telehealth: Payer: Self-pay | Admitting: Gastroenterology

## 2020-10-10 DIAGNOSIS — Z1211 Encounter for screening for malignant neoplasm of colon: Secondary | ICD-10-CM

## 2020-10-10 MED ORDER — PLENVU 140 G PO SOLR: 1.0000 | Freq: Once | ORAL | 0 refills | Status: AC

## 2020-10-10 NOTE — Telephone Encounter (Signed)
[facility].  Patient called and notified.

## 2020-10-10 NOTE — Telephone Encounter (Signed)
Pt is wanting to inform the nurse that her PLENVU hasn't been called in to her pharmacy.  [facility] [place]

## 2020-10-15 ENCOUNTER — Other Ambulatory Visit: Payer: Self-pay

## 2020-10-15 ENCOUNTER — Ambulatory Visit (AMBULATORY_SURGERY_CENTER): Payer: 59 | Admitting: Gastroenterology

## 2020-10-15 ENCOUNTER — Encounter: Payer: Self-pay | Admitting: Gastroenterology

## 2020-10-15 VITALS — BP 112/42 | HR 58 | Temp 97.1°F | Resp 14 | Ht 60.0 in | Wt 184.0 lb

## 2020-10-15 DIAGNOSIS — Z1211 Encounter for screening for malignant neoplasm of colon: Secondary | ICD-10-CM

## 2020-10-15 DIAGNOSIS — D123 Benign neoplasm of transverse colon: Secondary | ICD-10-CM

## 2020-10-15 DIAGNOSIS — D122 Benign neoplasm of ascending colon: Secondary | ICD-10-CM | POA: Diagnosis not present

## 2020-10-15 HISTORY — PX: COLONOSCOPY: SHX174

## 2020-10-15 MED ORDER — SODIUM CHLORIDE 0.9 % IV SOLN: 500.0000 mL | Freq: Once | INTRAVENOUS | Status: DC

## 2020-10-15 NOTE — Progress Notes (Signed)
Report to PACU, RN, vss, BBS= Clear.  

## 2020-10-15 NOTE — Patient Instructions (Signed)
Information on polyps and diverticulosis given to you today.  Await pathology results.  Follow a high fiber diet and drink at least 64 ounces of water per day.  Use fiber, for example Fibercon, Citrucel, Konsyl or Metamucil 1-2 times daily to try and bulk stools  YOU HAD AN ENDOSCOPIC PROCEDURE TODAY AT [facility]:   Refer to the procedure report that was given to you for any specific questions about what was found during the examination.  If the procedure report does not answer your questions, please call your gastroenterologist to clarify.  If you requested that your care partner not be given the details of your procedure findings, then the procedure report has been included in a sealed envelope for you to review at your convenience later.  YOU SHOULD EXPECT: Some feelings of bloating in the abdomen. Passage of more gas than usual.  Walking can help get rid of the air that was put into your GI tract during the procedure and reduce the bloating. If you had a lower endoscopy (such as a colonoscopy or flexible sigmoidoscopy) you may notice spotting of blood in your stool or on the toilet paper. If you underwent a bowel prep for your procedure, you may not have a normal bowel movement for a few days.  Please Note:  You might notice some irritation and congestion in your nose or some drainage.  This is from the oxygen used during your procedure.  There is no need for concern and it should clear up in a day or so.  SYMPTOMS TO REPORT IMMEDIATELY:   Following lower endoscopy (colonoscopy or flexible sigmoidoscopy):  Excessive amounts of blood in the stool  Significant tenderness or worsening of abdominal pains  Swelling of the abdomen that is new, acute  Fever of 100F or higher   For urgent or emergent issues, a gastroenterologist can be reached at any hour by calling [phone]. Do not use MyChart messaging for urgent concerns.    DIET:  We do recommend a small meal at  first, but then you may proceed to your regular diet.  Drink plenty of fluids but you should avoid alcoholic beverages for 24 hours.  ACTIVITY:  You should plan to take it easy for the rest of today and you should NOT DRIVE or use heavy machinery until tomorrow (because of the sedation medicines used during the test).    FOLLOW UP: Our staff will call the number listed on your records 48-72 hours following your procedure to check on you and address any questions or concerns that you may have regarding the information given to you following your procedure. If we do not reach you, we will leave a message.  We will attempt to reach you two times.  During this call, we will ask if you have developed any symptoms of COVID 19. If you develop any symptoms (ie: fever, flu-like symptoms, shortness of breath, cough etc.) before then, please call [phone].  If you test positive for Covid 19 in the 2 weeks post procedure, please call and report this information to [place].    If any biopsies were taken you will be contacted by phone or by letter within the next 1-3 weeks.  Please call us at [phone] if you have not heard about the biopsies in 3 weeks.    SIGNATURES/CONFIDENTIALITY: You and/or your care partner have signed paperwork which will be entered into your electronic medical record.  These signatures attest to the fact that that  the information above on your After Visit Summary has been reviewed and is understood.  Full responsibility of the confidentiality of this discharge information lies with you and/or your care-partner.

## 2020-10-15 NOTE — Progress Notes (Signed)
Called to room to assist during endoscopic procedure.  Patient ID and intended procedure confirmed with present staff. Received instructions for my participation in the procedure from the performing physician.  

## 2020-10-15 NOTE — Progress Notes (Signed)
Pt's states no medical or surgical changes since previsit or office visit. 

## 2020-10-15 NOTE — Op Note (Signed)
[facility] Patient Name: [name] Procedure Date: 10/15/2020 9:21 AM MRN: [id number] Endoscopist: Thornton Park MD, MD Age: 54 Referring MD:  Date of Birth: 1966/06/04 Gender: Female Account #: [id number] Procedure:                Colonoscopy Indications:              Screening for colorectal malignant neoplasm, This                            is the patient's first colonoscopy                           No known family history of colon cancer or polyps Medicines:                Monitored Anesthesia Care Procedure:                Pre-Anesthesia Assessment:                           - Prior to the procedure, a History and Physical                            was performed, and patient medications and                            allergies were reviewed. The patient's tolerance of                            previous anesthesia was also reviewed. The risks                            and benefits of the procedure and the sedation                            options and risks were discussed with the patient.                            All questions were answered, and informed consent                            was obtained. Prior Anticoagulants: The patient has                            taken no previous anticoagulant or antiplatelet                            agents. ASA Grade Assessment: II - A patient with                            mild systemic disease. After reviewing the risks                            and benefits, the patient was deemed in  satisfactory condition to undergo the procedure.                           After obtaining informed consent, the colonoscope                            was passed under direct vision. Throughout the                            procedure, the patient's blood pressure, pulse, and                            oxygen saturations were monitored continuously. The                            Olympus CF-HQ190L  ([id number]) Colonoscope was                            introduced through the anus and advanced to the the                            cecum, identified by appendiceal orifice and                            ileocecal valve. The colonoscopy was performed with                            moderate difficulty due to a redundant colon,                            significant looping and a tortuous colon.                            Successful completion of the procedure was aided by                            applying abdominal pressure. The patient tolerated                            the procedure well. The quality of the bowel                            preparation was good. The ileocecal valve,                            appendiceal orifice, and rectum were photographed. Scope In: 9:27:16 AM Scope Out: 9:45:03 AM Scope Withdrawal Time: 0 hours 13 minutes 14 seconds  Total Procedure Duration: 0 hours 17 minutes 47 seconds  Findings:                 The perianal and digital rectal examinations were                            normal. Small internal hemorrhoids present.  A few small and large-mouthed diverticula were                            found in the sigmoid colon, descending colon and                            ascending colon.                           A 3 mm polyp was found in the distal transverse                            colon. The polyp was flat. The polyp was removed                            with a cold snare. Resection and retrieval were                            complete. Estimated blood loss was minimal.                           A 2 mm polyp was found in the ascending colon. The                            polyp was flat. The polyp was removed with a cold                            snare. Resection and retrieval were complete.                            Estimated blood loss was minimal.                           The exam was otherwise without  abnormality on                            direct and retroflexion views. Complications:            No immediate complications. Estimated blood loss:                            Minimal. Estimated Blood Loss:     Estimated blood loss was minimal. Impression:               - Diverticulosis in the sigmoid colon, in the                            descending colon and in the ascending colon.                           - One 3 mm polyp in the distal transverse colon,                            removed with a cold snare. Resected and  retrieved.                           - One 2 mm polyp in the ascending colon, removed                            with a cold snare. Resected and retrieved.                           - The examination was otherwise normal on direct                            and retroflexion views. Recommendation:           - Patient has a contact number available for                            emergencies. The signs and symptoms of potential                            delayed complications were discussed with the                            patient. Return to normal activities tomorrow.                            Written discharge instructions were provided to the                            patient.                           - Follow a high fiber diet. Drink at least 64                            ounces of water daily. Add a daily stool bulking                            agent such as psyllium (an exampled would be                            Metamucil).                           - Continue present medications.                           - Await pathology results.                           - Repeat colonoscopy date to be determined after                            pending pathology results are reviewed for  surveillance.                           - Emerging evidence supports eating a diet of                            fruits, vegetables, grains, calcium, and  yogurt                            while reducing red meat and alcohol may reduce the                            risk of colon cancer.                           - Thank you for allowing me to be involved in your                            colon cancer prevention. Thornton Park MD, MD 10/15/2020 9:51:10 AM This report has been signed electronically.

## 2020-10-18 ENCOUNTER — Telehealth: Payer: Self-pay

## 2020-10-18 NOTE — Telephone Encounter (Signed)
  Follow up Call-  Call back number 10/15/2020  Post procedure Call Back phone  # [phone]  Permission to leave phone message Yes  Some recent data might be hidden     Patient questions:  Do you have a fever, pain , or abdominal swelling? No. Pain Score  0 *  Have you tolerated food without any problems? Yes.    Have you been able to return to your normal activities? Yes.    Do you have any questions about your discharge instructions: Diet   No. Medications  No. Follow up visit  No.  Do you have questions or concerns about your Care? No.  Actions: * If pain score is 4 or above: No action needed, pain <4.  1. Have you developed a fever since your procedure? no  2.   Have you had an respiratory symptoms (SOB or cough) since your procedure? no  3.   Have you tested positive for COVID 19 since your procedure no  4.   Have you had any family members/close contacts diagnosed with the COVID 19 since your procedure?  no   If yes to any of these questions please route to Joylene John, RN and Joella Prince, RN

## 2020-10-24 ENCOUNTER — Encounter: Payer: Self-pay | Admitting: Neurology

## 2020-10-24 NOTE — Progress Notes (Signed)
Received fax approval from [facility] for Ocrevus with [facility] [id number] valid from 10/15/20 to 10/15/21. Auth #: [id number].

## 2020-10-25 ENCOUNTER — Encounter: Payer: Self-pay | Admitting: Gastroenterology

## 2020-11-12 NOTE — Progress Notes (Signed)
Virtual Visit via Video Note The purpose of this virtual visit is to provide medical care while limiting exposure to the novel coronavirus.    Consent was obtained for video visit:  Yes  Answered questions that patient had about telehealth interaction:  Yes I discussed the limitations, risks, security and privacy concerns of performing an evaluation and management service by telemedicine. I also discussed with the patient that there may be a patient responsible charge related to this service. The patient expressed understanding and agreed to proceed.  Pt location: Home Physician Location: office Name of referring provider:  [facility] connected with Tyson Babinski at patients initiation/request on 11/13/2020 at  1:30 PM EST by video enabled telemedicine application and verified that I am speaking with the correct person using two identifiers. Pt MRN:  [id number] Pt DOB:  10-13-1966 Video Participants:  Tyson Babinski;     History of Present Illness:  [name] is a 55 year old right-handed female who follows up for multiple sclerosis.  UPDATE: Current DMT:  Ocrevus (started in August) Current medications:  D3 2000 IU daily, B12 173mcg daily, ibuprofen 600mg  at bedtime (for leg soreness)  JC Virus antibody was positive with low index of 0.4.  However, she wished to instead start [place].  Hepatitis B panel was negative.  Quantitative immunoglobulin panel demonstrated low IgM 38 with normal IgA 148 and IgG 1,281.  Vit D was 40.94.  She was advised to increase D3 to 2000 IU daily.  Vision:  No issues Motor:  Feels weak due to pain in legs Sensory:  No issues Pain:  Legs still sore. Feels like a muscle spasm.  Pain over her left eye resolved. Gait:  Unsteady when legs hurt Bowel/Bladder:  No issues Fatigue:  No issues Cognition:  No issues Mood:  Some anxiety.     HISTORY: She began having some balance problems and vision problems when she was in  her early 88s.  In 2017, she began experiencing bilateral foot numbness and tingling as well as balance problems.  She saw neurology and had a NCV-EMG on 03/18/2016 which was normal.  Labs demonstrated negative ANA, sed rate 11, elevated CRP 7.4, B12 315, folate 9.2, non-reactive RPR, TSH 2.280, Hgb A1c 5.7 and low vit D 9.7.  She couldn't tolerate gabapentin  In March 2021, she began having left periorbital eye pain, not with movement, but aggravated when rubbing the eye.  She also notes blurred vision in the left eye.  She saw the eye doctor and was found only to have dry eye.    Since 2020 she has had left sided neck pain that radiates down the left arm with numbness in the fingers, aggravated with certain body movements.  No weakness.  She had an MRI of the cervical spine without contrast on 02/25/2020 which demonstrated multiple hyperintense lesions throughout the cervical and upper thoracic cord.  Follow up MRI of brain with and without contrast on 03/31/2020 showed multiple T2 hyperintense lesions involving the periventricular, subcortical and juxtacortical white matter with involvement of the callososeptal interface and brainstem, no enhancement.    No family history of MS.  However, her mother has RA.  Past medications:  Gabapentin (caused confusion)  Past Medical History: Past Medical History:  Diagnosis Date  . Depression   . Heart murmur    as a child  . Hypercholesteremia   . Low back pain   . MS (multiple sclerosis) ([place])   . Neuromuscular  disorder ([place])    MS dx 04/2020  . Numbness and tingling 2017   Feet  . PAC (premature atrial contraction) 1998  . Panic attacks     Medications: Outpatient Encounter Medications as of 11/13/2020  Medication Sig  . aspirin 325 MG tablet Take 325 mg by mouth daily as needed. pain (Patient not taking: Reported on 10/15/2020)  . furosemide (LASIX) 20 MG tablet Take 20 mg by mouth every morning.  Marland Kitchen ibuprofen (ADVIL,MOTRIN) 600 MG tablet  Take 1 tablet (600 mg total) by mouth every 6 (six) hours as needed. (Patient not taking: Reported on 10/15/2020)  . ocrelizumab 600 mg in sodium chloride 0.9 % 500 mL Inject 600 mg into the vein once.   No facility-administered encounter medications on file as of 11/13/2020.    Allergies: Allergies  Allergen Reactions  . Shellfish-Derived Products Hives    Family History: Family History  Problem Relation Age of Onset  . Seizures Mother   . Arthritis Mother   . Heart disease Maternal Grandfather   . Heart disease Maternal Grandmother   . Hypertension Sister   . COPD Brother   . Colon cancer Neg Hx   . Colon polyps Neg Hx   . Esophageal cancer Neg Hx   . Stomach cancer Neg Hx   . Rectal cancer Neg Hx     Social History: Social History   Socioeconomic History  . Marital status: Single    Spouse name: Not on file  . Number of children: 1  . Years of education: College  . Highest education level: Not on file  Occupational History  . Occupation: Tree surgeon  Tobacco Use  . Smoking status: Former Research scientist (life sciences)  . Smokeless tobacco: Never Used  . Tobacco comment: Quit 10+ years ago.  Vaping Use  . Vaping Use: Every day  . Substances: CBD  Substance and Sexual Activity  . Alcohol use: Yes    Alcohol/week: 0.0 standard drinks    Comment: occasionally  . Drug use: No  . Sexual activity: Not on file  Other Topics Concern  . Not on file  Social History Narrative   Lives at home with son.   Right-handed.   1 soft drink per day.   Social Determinants of Health   Financial Resource Strain: Not on file  Food Insecurity: Not on file  Transportation Needs: Not on file  Physical Activity: Not on file  Stress: Not on file  Social Connections: Not on file  Intimate Partner Violence: Not on file    Observations/Objective:   No acute distress.  Alert and oriented.  Speech fluent and not dysarthric.  Language intact.  Eyes orthophoric on primary gaze.  Face  symmetric.  Assessment and Plan:   Multiple sclerosis  1.  DMT:  Ocrevus 2.  Check quantitative immunoglobulin panel and vitamin D level this week and again in 6 months (about a week prior to follow up) 3.  Repeat MRI of brain and cervical spine with and without contrast in 6 months (prior to follow up)O 4.  For leg spasms/pain, baclofen 5mg  three times daily as needed.  Cautioned for drowsiness.  5.  Refer to physical therapy to help with leg weakness and stiffness. 6.  D3 2000 IU daily 7.  Follow up 6 months.  Follow Up Instructions:    -I discussed the assessment and treatment plan with the patient. The patient was provided an opportunity to ask questions and all were answered. The patient agreed with the plan  and demonstrated an understanding of the instructions.   The patient was advised to call back or seek an in-person evaluation if the symptoms worsen or if the condition fails to improve as anticipated.    Dudley Major, DO

## 2020-11-13 ENCOUNTER — Telehealth (INDEPENDENT_AMBULATORY_CARE_PROVIDER_SITE_OTHER): Payer: 59 | Admitting: Neurology

## 2020-11-13 ENCOUNTER — Encounter: Payer: Self-pay | Admitting: Neurology

## 2020-11-13 VITALS — Ht 60.0 in | Wt 178.0 lb

## 2020-11-13 DIAGNOSIS — R2681 Unsteadiness on feet: Secondary | ICD-10-CM

## 2020-11-13 DIAGNOSIS — G35 Multiple sclerosis: Secondary | ICD-10-CM | POA: Diagnosis not present

## 2020-11-13 DIAGNOSIS — R29898 Other symptoms and signs involving the musculoskeletal system: Secondary | ICD-10-CM | POA: Diagnosis not present

## 2020-11-13 MED ORDER — BACLOFEN 5 MG PO TABS: ORAL_TABLET | ORAL | 5 refills | Status: DC

## 2020-11-13 NOTE — Patient Instructions (Signed)
1.  For leg pain, start baclofen 5mg .  May take up to 3 times a day as needed but caution for drowsiness 2.  Refer to physical therapy 3.  Check blood work today and again in 6 months (about a week prior to follow up with me) 4.  Repeat MRI in 6 months (within a week or so before follow up with me) 5.  Continue Ocrevus infusion every 6 months and D3 2000 IU daily

## 2020-11-13 NOTE — Addendum Note (Signed)
Addended by: Venetia Night on: 11/13/2020 03:30 PM   Modules accepted: Orders

## 2020-11-15 ENCOUNTER — Other Ambulatory Visit: Payer: Self-pay

## 2020-11-15 ENCOUNTER — Other Ambulatory Visit (INDEPENDENT_AMBULATORY_CARE_PROVIDER_SITE_OTHER): Payer: 59

## 2020-11-15 DIAGNOSIS — G35 Multiple sclerosis: Secondary | ICD-10-CM | POA: Diagnosis not present

## 2020-11-15 LAB — VITAMIN D 25 HYDROXY (VIT D DEFICIENCY, FRACTURES): VITD: 49.19 ng/mL (ref 30.00–100.00)

## 2020-11-16 LAB — IGG, IGA, IGM
IgG (Immunoglobin G), Serum: 1260 mg/dL (ref 600–1640)
IgM, Serum: 20 mg/dL — ABNORMAL LOW (ref 50–300)
Immunoglobulin A: 136 mg/dL (ref 47–310)

## 2020-11-21 ENCOUNTER — Other Ambulatory Visit: Payer: Self-pay

## 2020-11-21 ENCOUNTER — Ambulatory Visit: Payer: 59 | Attending: Neurology

## 2020-11-21 DIAGNOSIS — M6281 Muscle weakness (generalized): Secondary | ICD-10-CM | POA: Diagnosis present

## 2020-11-21 DIAGNOSIS — R2681 Unsteadiness on feet: Secondary | ICD-10-CM

## 2020-11-21 DIAGNOSIS — R262 Difficulty in walking, not elsewhere classified: Secondary | ICD-10-CM | POA: Diagnosis present

## 2020-11-21 DIAGNOSIS — R2689 Other abnormalities of gait and mobility: Secondary | ICD-10-CM

## 2020-11-21 NOTE — Therapy (Signed)
[facility] [address] [place], [place], [address] Phone: [phone]   Fax:  [phone]  Physical Therapy Evaluation  Patient Details  Name: [name] MRN: [id number] Date of Birth: 08/24/1966 Referring Provider (PT): Metta Clines, DO   Encounter Date: 11/21/2020   PT End of Session - 11/21/20 0804    Visit Number 1    Number of Visits 13    Date for PT Re-Evaluation 02/19/21   POC for 6 weeks, Cert for 90 days   Authorization Type UHC (VL: 60)    PT Start Time 0803    PT Stop Time 0845    PT Time Calculation (min) 42 min    Equipment Utilized During Treatment Gait belt    Activity Tolerance Patient tolerated treatment well    Behavior During Therapy WFL for tasks assessed/performed           Past Medical History:  Diagnosis Date  . Depression   . Heart murmur    as a child  . Hypercholesteremia   . Low back pain   . MS (multiple sclerosis) ([place])   . Neuromuscular disorder ([place])    MS dx 04/2020  . Numbness and tingling 2017   Feet  . PAC (premature atrial contraction) 1998  . Panic attacks     Past Surgical History:  Procedure Laterality Date  . ABDOMINAL HYSTERECTOMY  2007  . cesarian     1 time  . COLONOSCOPY  10/15/2020   [place]  . fibriod removal     uterine    There were no vitals filed for this visit.    Subjective Assessment - 11/21/20 0809    Subjective Patient reports that she was diagnosed of August 2020 with Multiple Sclerosis. Patient believes that she has been suffering from this in her 59's as well. Patient is currently ambulating with SPC with quad tip attachment since October indoors/outdoors. Patient has been losing her balance and has had two falls. Patient reports the right leg gets weak on her and feels as it will buckle on her. Patient reports that endurance has decreased but able to walk about 1 hour before she needs to rest. patient also reports spasms  in BLE.    Pertinent History LBP, PAC, Panic Attacks, Depression    Limitations Standing;Walking    How long can you walk comfortably? 1 hr.    Diagnostic tests MRI of the cervical spine without contrast on 02/25/2020 which demonstrated multiple hyperintense lesions throughout the cervical and upper thoracic cord.  Follow up MRI of brain with and without contrast on 03/31/2020 showed multiple T2 hyperintense lesions involving the periventricular, subcortical and juxtacortical white matter with involvement of the callososeptal interface and brainstem, no enhancemen    Patient Stated Goals learn how to get herself stronger;    Currently in Pain? No/denies              [facility] PT Assessment - 11/21/20 0001      Assessment   Medical Diagnosis BLE Weakness/Gait Abnormality (MS)    Referring Provider (PT) Metta Clines, DO    Onset Date/Surgical Date 11/13/20   referral date   Hand Dominance Right    Next MD Visit July 2022    Prior Therapy None      Precautions   Precautions Fall      Restrictions   Weight Bearing Restrictions No      Balance Screen   Has the patient fallen in the  past 6 months Yes    How many times? 2    Has the patient had a decrease in activity level because of a fear of falling?  Yes    Is the patient reluctant to leave their home because of a fear of falling?  Yes   more cautious     [place]    Available Help at Discharge Family    Type of [place] to enter    Entrance Stairs-Number of Steps [place] One level    [place] - single point    Additional Comments 54 year old son lives with her.      Prior Function   Level of Independence Independent with household mobility with device;Independent with community mobility with device    Vocation Full time employment    Audiological scientist; On her  feet majority of the day, does get breaks throughout.      Cognition   Overall Cognitive Status Within Functional Limits for tasks assessed      Sensation   Light Touch Impaired by gross assessment    Additional Comments Altered sensation on BLE (more numbness) but able to detect light touch      Coordination   Gross Motor Movements are Fluid and Coordinated Yes      ROM / Strength   AROM / PROM / Strength AROM;Strength      AROM   Overall AROM  Deficits    Overall AROM Comments due to muscle tension/decreased muscle strength      Strength   Overall Strength Deficits    Strength Assessment Site Hip;Knee;Ankle    Right/Left Hip Right;Left    Right Hip Flexion 3+/5    Right Hip ABduction 3-/5    Right Hip ADduction 3-/5    Left Hip Flexion 4/5    Left Hip ABduction 4/5    Left Hip ADduction 4/5    Right/Left Knee Right;Left    Right Knee Flexion 3+/5    Right Knee Extension 3+/5    Left Knee Flexion 4/5    Left Knee Extension 4/5    Right/Left Ankle Right;Left    Right Ankle Dorsiflexion 3+/5    Left Ankle Dorsiflexion 4-/5      Bed Mobility   Bed Mobility --   Independent; more time required with getting out of bed per patient reports     Transfers   Transfers Sit to Stand;Stand to Sit    Sit to Stand 5: Supervision    Five time sit to stand comments  22.25 secs with UE support    Stand to Sit 5: Supervision      Ambulation/Gait   Ambulation/Gait Yes    Ambulation/Gait Assistance 5: Supervision;4: Min guard    Ambulation/Gait Assistance Details Patient ambulating with HurryCane x 100 ft. Patient demo decreased step length. Attempted to ambulate with cane on L side to provide support for RLE but difficulty sequencing. Patient prefering to use with RUE at this time but demo improved sequenicng with cues. Mild Fatigue after ambulation    Ambulation Distance (Feet) 100 Feet    Assistive device Straight cane    Gait Pattern Step-through pattern;Decreased arm swing -  right;Decreased step length - right    Ambulation Surface Level;Unlevel    Gait velocity 19.63 secs = 1.67 ft/sec  Stairs Yes    Stairs Assistance 5: Supervision;4: Min guard    Stairs Assistance Details (indicate cue type and reason) patient ascend/descend with step to pattern leading with LLE at all times. PT providing verbal cues for improved sequencing and to ascend with LLE and descend with RLE for improved stair negotiation. patient reports improved completion. Close CGA/supervision with completion. Patient using rail and SPC to demonstrate how to complete at home.    Stair Management Technique One rail Right;Step to pattern;Forwards;With cane    Number of Stairs 8    Height of Stairs 6      Balance   Balance Assessed Yes    Balance comment Able to stand feet apart without UE support for 1 minute increased sway. Standing EC with feet apart for 20 seconds. Standing with narrow BOS and eyes open for 1 minute.      Standardized Balance Assessment   Standardized Balance Assessment Timed Up and Go Test      Timed Up and Go Test   TUG Normal TUG    Normal TUG (seconds) 16.12    TUG Comments with hurrycane                      Objective measurements completed on examination: See above findings.               PT Education - 11/21/20 0844    Education Details Educated on [employer]; Corporate treasurer) Educated Patient    Methods Explanation    Comprehension Verbalized understanding            PT Short Term Goals - 11/21/20 0900      PT SHORT TERM GOAL #1   Title Patient will be independent with inital HEP for strengthening/balance (All STGs due: 12/12/20)    Baseline no HEP established    Time 3    Period Weeks    Status New    Target Date 12/12/20      PT SHORT TERM GOAL #2   Title Patient will improve 5x sit <> stand to </= 18 seconds to demonstrate improved balance and functional strength    Baseline 22.25 secs    Time  3    Period Weeks    Status New      PT SHORT TERM GOAL #3   Title Berg Balance to be assessed and LTG to be set as appropriate    Baseline TBA    Time 3    Period Weeks    Status New             PT Long Term Goals - 11/21/20 0902      PT LONG TERM GOAL #1   Title Patient will be independent with final HEP for strengthening/balance (All LTGs Due:01/02/21)    Baseline no HEP established    Time 6    Period Weeks    Status New    Target Date 01/02/21      PT LONG TERM GOAL #2   Title LTG to be set for [employer]    Baseline TBA    Time 6    Period Weeks    Status New      PT LONG TERM GOAL #3   Title Patient will improve TUG to </= 12 seconds to demosntrate reduced fall risk    Baseline 16.12 secs    Time 6    Period Weeks    Status New  PT LONG TERM GOAL #4   Title Patient will improve 5x sit <> stand to </= 15 seconds to demonstrate reduced fall risk    Baseline 22.25 secs with UE support    Time 6    Period Weeks    Status New      PT LONG TERM GOAL #5   Title Patient will improve gait speed to >/= 2.0 ft/sec to demonstrate improved community ambulation    Baseline 1.67 ft/sec    Time 6    Period Weeks    Status New      Additional Long Term Goals   Additional Long Term Goals Yes      PT LONG TERM GOAL #6   Title Patient will demonstrate ability to ascend/descend 8 stairs with single rail and reciprocal pattern for improved household entry/exit    Baseline rail + SPC step to pattern, close CGA    Time 6    Period Weeks    Status New                  Plan - 11/21/20 0856    Clinical Impression Statement Patient is a 55 y.o. female that was referred to Neuro OPPT services for Muscle Weakness/Gait abnormality as was diagnosed with Multiple Sclerosis last year. Patient's PMH significant for the following: LBP, PAC, Panic Attacks, Depression, MS. Upon evaluation patient presents with decreased muscle strength (RLE > LLE), impaired sensation,  impaired balance, abnormal gait, decreased endurance/activity tolerance and increased risk for falls. Patient is currently ambulating with hurrycane at 1.67 ft/sec demonstrating limited community ambulator. Patient is at increased fall risk with TUG time of 16.12 secs. Further balance assesment to be completed at next visit. Patient will benefit from skilled PT services to address impairments, reduce fall risk and maximize functional mobility.    Personal Factors and Comorbidities Comorbidity 3+;Time since onset of injury/illness/exacerbation    Comorbidities LBP, PAC, Panic Attacks, Depression, MS    Examination-Activity Limitations Bed Mobility;Transfers;Squat;Stairs;Stand;Locomotion Level    Examination-Participation Restrictions Occupation;Cleaning;Community Activity    Stability/Clinical Decision Making Evolving/Moderate complexity    Clinical Decision Making Moderate    Rehab Potential Good    PT Frequency 2x / week    PT Duration 6 weeks    PT Treatment/Interventions ADLs/Self Care Home Management;Aquatic Therapy;Cryotherapy;Moist Heat;DME Instruction;Gait training;Stair training;Functional mobility training;Therapeutic activities;Therapeutic exercise;Balance training;Neuromuscular re-education;Patient/family education;Orthotic Fit/Training;Manual techniques;Passive range of motion;Vestibular;Electrical Stimulation    PT Next Visit Plan Complete BERG. Initiate HEP for [place]/Balance    Consulted and Agree with Plan of Care Patient           Patient will benefit from skilled therapeutic intervention in order to improve the following deficits and impairments:  Abnormal gait,Decreased balance,Decreased endurance,Decreased mobility,Difficulty walking,Increased muscle spasms,Impaired sensation,Decreased range of motion,Decreased activity tolerance,Decreased knowledge of use of DME,Decreased strength,Impaired flexibility,Pain  Visit Diagnosis: Other abnormalities of gait and  mobility  Difficulty in walking, not elsewhere classified  Unsteadiness on feet  Muscle weakness (generalized)     Problem List Patient Active Problem List   Diagnosis Date Noted  . Anxiety 12/16/2017  . Balance problem 02/27/2016  . Paresthesia 02/27/2016    Jones Bales, PT, DPT 11/21/2020, 9:05 AM  [facility] [address] [place], [place], [address] Phone: [phone]   Fax:  [phone]  Name: [name] MRN: [id number] Date of Birth: Apr 16, 1966

## 2020-11-22 ENCOUNTER — Telehealth: Payer: Self-pay

## 2020-11-22 DIAGNOSIS — G35 Multiple sclerosis: Secondary | ICD-10-CM

## 2020-11-22 NOTE — Telephone Encounter (Signed)
-----   Message from [name], DO sent at 11/19/2020  9:42 AM EST ----- One of her markers of her immune system is a little low, but not low enough that I would change management.  Will need to continue monitoring.  I want to repeat the quantitative immunoglobulin panel in 6 months (should be performed about a week prior to her follow up with me).

## 2020-11-22 NOTE — Telephone Encounter (Signed)
Pt advised her lab results.  

## 2020-11-28 ENCOUNTER — Ambulatory Visit: Payer: 59 | Attending: Neurology

## 2020-11-28 DIAGNOSIS — R262 Difficulty in walking, not elsewhere classified: Secondary | ICD-10-CM | POA: Insufficient documentation

## 2020-11-28 DIAGNOSIS — R2689 Other abnormalities of gait and mobility: Secondary | ICD-10-CM | POA: Insufficient documentation

## 2020-11-28 DIAGNOSIS — M6281 Muscle weakness (generalized): Secondary | ICD-10-CM | POA: Insufficient documentation

## 2020-11-28 DIAGNOSIS — R2681 Unsteadiness on feet: Secondary | ICD-10-CM | POA: Insufficient documentation

## 2020-11-30 ENCOUNTER — Encounter: Payer: Self-pay | Admitting: Physical Therapy

## 2020-11-30 ENCOUNTER — Ambulatory Visit: Payer: 59 | Admitting: Physical Therapy

## 2020-11-30 ENCOUNTER — Other Ambulatory Visit: Payer: Self-pay

## 2020-11-30 DIAGNOSIS — R2689 Other abnormalities of gait and mobility: Secondary | ICD-10-CM

## 2020-11-30 DIAGNOSIS — M6281 Muscle weakness (generalized): Secondary | ICD-10-CM | POA: Diagnosis present

## 2020-11-30 DIAGNOSIS — R2681 Unsteadiness on feet: Secondary | ICD-10-CM

## 2020-11-30 DIAGNOSIS — R262 Difficulty in walking, not elsewhere classified: Secondary | ICD-10-CM

## 2020-11-30 NOTE — Therapy (Signed)
[facility] [address] [place], [place], [address] Phone: [phone]   Fax:  [phone]  Physical Therapy Treatment  Patient Details  Name: [name] MRN: [id number] Date of Birth: [birth year] Referring Provider (PT): Metta Clines, DO   Encounter Date: 11/30/2020   PT End of Session - 11/30/20 1204    Visit Number 2    Number of Visits 13    Date for PT Re-Evaluation 02/19/21   POC for 6 weeks, Cert for 90 days   Authorization Type UHC (VL: 60)    PT Start Time 0801    PT Stop Time 0844    PT Time Calculation (min) 43 min    Equipment Utilized During Treatment Gait belt    Activity Tolerance Patient tolerated treatment well    Behavior During Therapy WFL for tasks assessed/performed           Past Medical History:  Diagnosis Date  . Depression   . Heart murmur    as a child  . Hypercholesteremia   . Low back pain   . MS (multiple sclerosis) ([place])   . Neuromuscular disorder ([place])    MS dx 04/2020  . Numbness and tingling 2017   Feet  . PAC (premature atrial contraction) 1998  . Panic attacks     Past Surgical History:  Procedure Laterality Date  . ABDOMINAL HYSTERECTOMY  2007  . cesarian     1 time  . COLONOSCOPY  10/15/2020   [place]  . fibriod removal     uterine    There were no vitals filed for this visit.   Subjective Assessment - 11/30/20 0803    Subjective No falls, no changes since last time.    Pertinent History LBP, PAC, Panic Attacks, Depression    Limitations Standing;Walking    How long can you walk comfortably? 1 hr.    Diagnostic tests MRI of the cervical spine without contrast on 02/25/2020 which demonstrated multiple hyperintense lesions throughout the cervical and upper thoracic cord.  Follow up MRI of brain with and without contrast on 03/31/2020 showed multiple T2 hyperintense lesions involving the periventricular, subcortical and juxtacortical white matter with  involvement of the callososeptal interface and brainstem, no enhancemen    Patient Stated Goals learn how to get herself stronger;    Currently in Pain? No/denies              [facility] PT Assessment - 11/30/20 0804      Standardized Balance Assessment   Standardized Balance Assessment Berg Balance Test      Berg Balance Test   Sit to Stand Able to stand without using hands and stabilize independently    Standing Unsupported Able to stand safely 2 minutes    Sitting with Back Unsupported but Feet Supported on Floor or Stool Able to sit safely and securely 2 minutes    Stand to Sit Sits safely with minimal use of hands    Transfers Able to transfer safely, minor use of hands    Standing Unsupported with Eyes Closed Able to stand 10 seconds safely    Standing Unsupported with Feet Together Able to place feet together independently and stand 1 minute safely    From Standing, Reach Forward with Outstretched Arm Can reach confidently >25 cm (10")    From Standing Position, Pick up Object from Floor Able to pick up shoe safely and easily    From Standing Position, Turn to Look Behind Over  each Shoulder Looks behind from both sides and weight shifts well    Turn 360 Degrees Able to turn 360 degrees safely one side only in 4 seconds or less   3.65 to L, 5.38 to R   Standing Unsupported, Alternately Place Feet on Step/Stool Able to complete >2 steps/needs minimal assist    Standing Unsupported, One Foot in Front Able to plae foot ahead of the other independently and hold 30 seconds    Standing on One Leg Tries to lift leg/unable to hold 3 seconds but remains standing independently    Total Score 48    Berg comment: 48/56 = moderate risk of falls              Access Code: [email or web address] URL: [email or web address] Date: 11/30/2020 Prepared by: Janann August  Initiated HEP - see MedBridge for more details.   Exercises Sit to Stand - 2 x daily - 4 x weekly - 2 sets - 5  reps Supine Bridge - 2 x daily - 4 x weekly - 1 sets - 10 reps Clamshell - 2 x daily - 4 x weekly - 2 sets - 5 reps - verbal and tactile cues for proper technique  Romberg Stance - 2 x daily - 4 x weekly - 2 sets - 10 reps - in corner with head turns R/L  Alternating Step Taps with Counter Support - 2 x daily - 4 x weekly - 2 sets - 10 reps                     PT Education - 11/30/20 1204    Education Details results of BERG, initial HEP    Person(s) Educated Patient    Methods Explanation;Demonstration;Handout    Comprehension Verbalized understanding;Returned demonstration            PT Short Term Goals - 11/30/20 1208      PT SHORT TERM GOAL #1   Title Patient will be independent with inital HEP for strengthening/balance (All STGs due: 12/12/20)    Baseline no HEP established    Time 3    Period Weeks    Status New    Target Date 12/12/20      PT SHORT TERM GOAL #2   Title Patient will improve 5x sit <> stand to </= 18 seconds to demonstrate improved balance and functional strength    Baseline 22.25 secs    Time 3    Period Weeks    Status New      PT SHORT TERM GOAL #3   Title Berg Balance to be assessed and LTG to be set as appropriate    Baseline 48/56    Time 3    Period Weeks    Status Achieved             PT Long Term Goals - 11/30/20 1208      PT LONG TERM GOAL #1   Title Patient will be independent with final HEP for strengthening/balance (All LTGs Due:01/02/21)    Baseline no HEP established    Time 6    Period Weeks    Status New      PT LONG TERM GOAL #2   Title Pt will improve BERG score to at least a 52/56 in order to demo decr fall risk.    Baseline 48/56    Time 6    Period Weeks    Status Revised      PT LONG TERM GOAL #3  Title Patient will improve TUG to </= 12 seconds to demosntrate reduced fall risk    Baseline 16.12 secs    Time 6    Period Weeks    Status New      PT LONG TERM GOAL #4   Title Patient will  improve 5x sit <> stand to </= 15 seconds to demonstrate reduced fall risk    Baseline 22.25 secs with UE support    Time 6    Period Weeks    Status New      PT LONG TERM GOAL #5   Title Patient will improve gait speed to >/= 2.0 ft/sec to demonstrate improved community ambulation    Baseline 1.67 ft/sec    Time 6    Period Weeks    Status New      PT LONG TERM GOAL #6   Title Patient will demonstrate ability to ascend/descend 8 stairs with single rail and reciprocal pattern for improved household entry/exit    Baseline rail + SPC step to pattern, close CGA    Time 6    Period Weeks    Status New                 Plan - 11/30/20 1205    Clinical Impression Statement Performed the BERG today with pt scoring a 48/56, indicating a moderate risk for falls. LTG written as appropriate. Pt may also benefit from further balance testing such as the DGI to determine fall risk. Remainder of session focused on initiating HEP for BLE strengthening and standing balance in the corner/at the counter. Pt tolerated session well. Will continue to progress towards LTGs.    Personal Factors and Comorbidities Comorbidity 3+;Time since onset of injury/illness/exacerbation    Comorbidities LBP, PAC, Panic Attacks, Depression, MS    Examination-Activity Limitations Bed Mobility;Transfers;Squat;Stairs;Stand;Locomotion Level    Examination-Participation Restrictions Occupation;Cleaning;Community Activity    Stability/Clinical Decision Making Evolving/Moderate complexity    Rehab Potential Good    PT Frequency 2x / week    PT Duration 6 weeks    PT Treatment/Interventions ADLs/Self Care Home Management;Aquatic Therapy;Cryotherapy;Moist Heat;DME Instruction;Gait training;Stair training;Functional mobility training;Therapeutic activities;Therapeutic exercise;Balance training;Neuromuscular re-education;Patient/family education;Orthotic Fit/Training;Manual techniques;Passive range of  motion;Vestibular;Electrical Stimulation    PT Next Visit Plan how is HEP? may also want to perform DGI for more dynamic balance. balance. BLE strengthening.    Consulted and Agree with Plan of Care Patient           Patient will benefit from skilled therapeutic intervention in order to improve the following deficits and impairments:  Abnormal gait,Decreased balance,Decreased endurance,Decreased mobility,Difficulty walking,Increased muscle spasms,Impaired sensation,Decreased range of motion,Decreased activity tolerance,Decreased knowledge of use of DME,Decreased strength,Impaired flexibility,Pain  Visit Diagnosis: Other abnormalities of gait and mobility  Difficulty in walking, not elsewhere classified  Unsteadiness on feet  Muscle weakness (generalized)     Problem List Patient Active Problem List   Diagnosis Date Noted  . Anxiety 12/16/2017  . Balance problem 02/27/2016  . Paresthesia 02/27/2016    Arliss Journey, PT, DPT  11/30/2020, 12:13 PM  [facility] [address] [place], [place], [address] Phone: [phone]   Fax:  [phone]  Name: [name] MRN: [id number] Date of Birth: 1966-02-10

## 2020-11-30 NOTE — Patient Instructions (Signed)
Access Code: [email or web address] URL: [email or web address] Date: 11/30/2020 Prepared by: Janann August  Exercises Sit to Stand - 2 x daily - 4 x weekly - 2 sets - 5 reps Supine Bridge - 2 x daily - 4 x weekly - 1 sets - 10 reps Clamshell - 2 x daily - 4 x weekly - 2 sets - 5 reps Romberg Stance - 2 x daily - 4 x weekly - 2 sets - 10 reps Alternating Step Taps with Counter Support - 2 x daily - 4 x weekly - 2 sets - 10 reps

## 2020-12-01 ENCOUNTER — Ambulatory Visit
Admission: RE | Admit: 2020-12-01 | Discharge: 2020-12-01 | Disposition: A | Discharge status: Home / Self Care | Payer: 59 | Source: Ambulatory Visit | Attending: Neurology | Admitting: Neurology

## 2020-12-01 DIAGNOSIS — G35 Multiple sclerosis: Secondary | ICD-10-CM

## 2020-12-01 MED ORDER — GADOBENATE DIMEGLUMINE 529 MG/ML IV SOLN
17.0000 mL | Freq: Once | INTRAVENOUS | Status: AC | PRN
  Administered 2020-12-01: 17 mL via INTRAVENOUS

## 2020-12-05 ENCOUNTER — Other Ambulatory Visit: Payer: Self-pay

## 2020-12-05 ENCOUNTER — Ambulatory Visit: Payer: 59

## 2020-12-05 DIAGNOSIS — R2689 Other abnormalities of gait and mobility: Secondary | ICD-10-CM | POA: Diagnosis not present

## 2020-12-05 DIAGNOSIS — R2681 Unsteadiness on feet: Secondary | ICD-10-CM

## 2020-12-05 DIAGNOSIS — R262 Difficulty in walking, not elsewhere classified: Secondary | ICD-10-CM

## 2020-12-05 DIAGNOSIS — M6281 Muscle weakness (generalized): Secondary | ICD-10-CM

## 2020-12-05 NOTE — Therapy (Signed)
[facility] [address] [place], [place], [address] Phone: [phone]   Fax:  [phone]  Physical Therapy Treatment  Patient Details  Name: [name] MRN: [id number] Date of Birth: Jan 30, 1966 Referring Provider (PT): Metta Clines, DO   Encounter Date: 12/05/2020   PT End of Session - 12/05/20 0849    Visit Number 3    Number of Visits 13    Date for PT Re-Evaluation 02/19/21   POC for 6 weeks, Cert for 90 days   Authorization Type UHC (VL: 60)    PT Start Time 0848    PT Stop Time 0930    PT Time Calculation (min) 42 min    Equipment Utilized During Treatment Gait belt    Activity Tolerance Patient tolerated treatment well    Behavior During Therapy WFL for tasks assessed/performed           Past Medical History:  Diagnosis Date  . Depression   . Heart murmur    as a child  . Hypercholesteremia   . Low back pain   . MS (multiple sclerosis) ([place])   . Neuromuscular disorder ([place])    MS dx 04/2020  . Numbness and tingling 2017   Feet  . PAC (premature atrial contraction) 1998  . Panic attacks     Past Surgical History:  Procedure Laterality Date  . ABDOMINAL HYSTERECTOMY  2007  . cesarian     1 time  . COLONOSCOPY  10/15/2020   [place]  . fibriod removal     uterine    There were no vitals filed for this visit.   Subjective Assessment - 12/05/20 0850    Subjective Patient reports no new changes or complaints. No falls to report. Reports exercises are going well.    Pertinent History LBP, PAC, Panic Attacks, Depression    Limitations Standing;Walking    How long can you walk comfortably? 1 hr.    Diagnostic tests MRI of the cervical spine without contrast on 02/25/2020 which demonstrated multiple hyperintense lesions throughout the cervical and upper thoracic cord.  Follow up MRI of brain with and without contrast on 03/31/2020 showed multiple T2 hyperintense lesions involving the  periventricular, subcortical and juxtacortical white matter with involvement of the callososeptal interface and brainstem, no enhancemen    Patient Stated Goals learn how to get herself stronger;    Currently in Pain? No/denies               [facility] Adult PT Treatment/Exercise - 12/05/20 0001      Transfers   Transfers Sit to Stand;Stand to Sit    Sit to Stand 5: Supervision    Stand to Sit 5: Supervision    Comments completed sit <> stand training x 10 reps with BLE placed on airex, close supervision with completion. mild fatigue at end of completion.      Ambulation/Gait   Ambulation/Gait Yes    Ambulation/Gait Assistance 5: Supervision;4: Min guard    Ambulation/Gait Assistance Details patient ambulating into/out of therapy session with hurrycane, close supervision    Assistive device Straight cane    Gait Pattern Step-through pattern;Decreased arm swing - right;Decreased step length - right    Ambulation Surface Level;Indoor      Standardized Balance Assessment   Standardized Balance Assessment Dynamic Gait Index   completed without AD and close supervision/CGA     Dynamic Gait Index   Level Surface Mild Impairment    Change in Gait Speed  Moderate Impairment    Gait with Horizontal Head Turns Moderate Impairment    Gait with Vertical Head Turns Mild Impairment    Gait and Pivot Turn Mild Impairment    Step Over Obstacle Mild Impairment    Step Around Obstacles Mild Impairment    Steps Moderate Impairment    Total Score 13      Exercises   Exercises Knee/Hip;Other Exercises    Other Exercises  With light UE support from chair completed alternating marching x 5 reps, then progressed to no UE support for x 10 reps. Patient demo increased challenge with marching and SLS without UE support, with close CGA. Standing iwth light UE suppot completed toe raises, x 10 reps with verbal cues for form.      Knee/Hip Exercises: Aerobic   Other Aerobic Completed SciFit on Level 1.5 x 6  minutes with BLE/BUE as warm up for improved motion/strength/activity tolerance.      Knee/Hip Exercises: Standing   Heel Raises Both;2 sets;10 reps;3 seconds;Limitations    Heel Raises Limitations completed with light UE support from chair.    Other Standing Knee Exercises Completed mini squats with light UE support, completed 2 x 10 reps, verbal cues for form.             PT Education - 12/05/20 0858    Education Details results of DGI    Person(s) Educated Patient    Methods Explanation    Comprehension Verbalized understanding            PT Short Term Goals - 11/30/20 1208      PT SHORT TERM GOAL #1   Title Patient will be independent with inital HEP for strengthening/balance (All STGs due: 12/12/20)    Baseline no HEP established    Time 3    Period Weeks    Status New    Target Date 12/12/20      PT SHORT TERM GOAL #2   Title Patient will improve 5x sit <> stand to </= 18 seconds to demonstrate improved balance and functional strength    Baseline 22.25 secs    Time 3    Period Weeks    Status New      PT SHORT TERM GOAL #3   Title Berg Balance to be assessed and LTG to be set as appropriate    Baseline 48/56    Time 3    Period Weeks    Status Achieved             PT Long Term Goals - 11/30/20 1208      PT LONG TERM GOAL #1   Title Patient will be independent with final HEP for strengthening/balance (All LTGs Due:01/02/21)    Baseline no HEP established    Time 6    Period Weeks    Status New      PT LONG TERM GOAL #2   Title Pt will improve BERG score to at least a 52/56 in order to demo decr fall risk.    Baseline 48/56    Time 6    Period Weeks    Status Revised      PT LONG TERM GOAL #3   Title Patient will improve TUG to </= 12 seconds to demosntrate reduced fall risk    Baseline 16.12 secs    Time 6    Period Weeks    Status New      PT LONG TERM GOAL #4   Title Patient will improve  5x sit <> stand to </= 15 seconds to demonstrate  reduced fall risk    Baseline 22.25 secs with UE support    Time 6    Period Weeks    Status New      PT LONG TERM GOAL #5   Title Patient will improve gait speed to >/= 2.0 ft/sec to demonstrate improved community ambulation    Baseline 1.67 ft/sec    Time 6    Period Weeks    Status New      PT LONG TERM GOAL #6   Title Patient will demonstrate ability to ascend/descend 8 stairs with single rail and reciprocal pattern for improved household entry/exit    Baseline rail + SPC step to pattern, close CGA    Time 6    Period Weeks    Status New                 Plan - 12/05/20 5462    Clinical Impression Statement Today's skilled PT session focused on continued activites to promote strengthening of BLE in standing, with patient tolerating well. Intermittent rest breaks required due to fatigue. No increase in pain report. DGI completed with patient scoring 13/24, demonstrating high fall risk. DGI completed without AD today. Patient will benefit from skilled services to continue address impairments and progress toward LTGs.    Personal Factors and Comorbidities Comorbidity 3+;Time since onset of injury/illness/exacerbation    Comorbidities LBP, PAC, Panic Attacks, Depression, MS    Examination-Activity Limitations Bed Mobility;Transfers;Squat;Stairs;Stand;Locomotion Level    Examination-Participation Restrictions Occupation;Cleaning;Community Activity    Stability/Clinical Decision Making Evolving/Moderate complexity    Rehab Potential Good    PT Frequency 2x / week    PT Duration 6 weeks    PT Treatment/Interventions ADLs/Self Care Home Management;Aquatic Therapy;Cryotherapy;Moist Heat;DME Instruction;Gait training;Stair training;Functional mobility training;Therapeutic activities;Therapeutic exercise;Balance training;Neuromuscular re-education;Patient/family education;Orthotic Fit/Training;Manual techniques;Passive range of motion;Vestibular;Electrical Stimulation    PT Next Visit  Plan Patient enjoyed SciFit, completed as warm up. Continue balance activites wihtout UE support. Continue BLE strengthening.    Consulted and Agree with Plan of Care Patient           Patient will benefit from skilled therapeutic intervention in order to improve the following deficits and impairments:  Abnormal gait,Decreased balance,Decreased endurance,Decreased mobility,Difficulty walking,Increased muscle spasms,Impaired sensation,Decreased range of motion,Decreased activity tolerance,Decreased knowledge of use of DME,Decreased strength,Impaired flexibility,Pain  Visit Diagnosis: Other abnormalities of gait and mobility  Difficulty in walking, not elsewhere classified  Unsteadiness on feet  Muscle weakness (generalized)     Problem List Patient Active Problem List   Diagnosis Date Noted  . Anxiety 12/16/2017  . Balance problem 02/27/2016  . Paresthesia 02/27/2016    Jones Bales, PT, DPT 12/05/2020, 9:28 AM  [facility] [address] [place], [place], [address] Phone: [phone]   Fax:  [phone]  Name: [name] MRN: [id number] Date of Birth: 11/26/1965

## 2020-12-07 ENCOUNTER — Encounter: Payer: Self-pay | Admitting: Physical Therapy

## 2020-12-07 ENCOUNTER — Other Ambulatory Visit: Payer: Self-pay

## 2020-12-07 ENCOUNTER — Ambulatory Visit: Payer: 59 | Admitting: Physical Therapy

## 2020-12-07 DIAGNOSIS — R262 Difficulty in walking, not elsewhere classified: Secondary | ICD-10-CM

## 2020-12-07 DIAGNOSIS — R2689 Other abnormalities of gait and mobility: Secondary | ICD-10-CM | POA: Diagnosis not present

## 2020-12-07 DIAGNOSIS — R2681 Unsteadiness on feet: Secondary | ICD-10-CM

## 2020-12-07 DIAGNOSIS — M6281 Muscle weakness (generalized): Secondary | ICD-10-CM

## 2020-12-08 NOTE — Therapy (Signed)
[facility] [address] [place], [place], [address] Phone: [phone]   Fax:  [phone]  Physical Therapy Treatment  Patient Details  Name: [name] MRN: [id number] Date of Birth: 05-12-1966 Referring Provider (PT): Metta Clines, DO   Encounter Date: 12/07/2020     12/07/20 0807  PT Visits / Re-Eval  Visit Number 4  Number of Visits 13  Date for PT Re-Evaluation 02/19/21 (POC for 6 weeks, Cert for 90 days)  Authorization  Authorization Type UHC (VL: 60)  PT Time Calculation  PT Start Time 0804  PT Stop Time 0845  PT Time Calculation (min) 41 min  PT - End of Session  Equipment Utilized During Treatment Gait belt  Activity Tolerance Patient tolerated treatment well  Behavior During Therapy [facility] for tasks assessed/performed    Past Medical History:  Diagnosis Date  . Depression   . Heart murmur    as a child  . Hypercholesteremia   . Low back pain   . MS (multiple sclerosis) ([place])   . Neuromuscular disorder ([place])    MS dx 04/2020  . Numbness and tingling 2017   Feet  . PAC (premature atrial contraction) 1998  . Panic attacks     Past Surgical History:  Procedure Laterality Date  . ABDOMINAL HYSTERECTOMY  2007  . cesarian     1 time  . COLONOSCOPY  10/15/2020   [place]  . fibriod removal     uterine    There were no vitals filed for this visit.     12/07/20 0807  Symptoms/Limitations  Subjective No new complaints. No falls or pain to report. HEP is going well at home.  Pertinent History LBP, PAC, Panic Attacks, Depression  Limitations Standing;Walking  How long can you walk comfortably? 1 hr.  Diagnostic tests MRI of the cervical spine without contrast on 02/25/2020 which demonstrated multiple hyperintense lesions throughout the cervical and upper thoracic cord.  Follow up MRI of brain with and without contrast on 03/31/2020 showed multiple T2 hyperintense lesions involving the  periventricular, subcortical and juxtacortical white matter with involvement of the callososeptal interface and brainstem, no enhancemen  Pain Assessment  Currently in Pain? No/denies      12/07/20 0808  Transfers  Transfers Sit to Stand;Stand to Sit  Sit to Stand 5: Supervision  Stand to Sit 5: Supervision  Ambulation/Gait  Ambulation/Gait Yes  Ambulation/Gait Assistance 5: Supervision;4: Min guard  Ambulation/Gait Assistance Details around the gym with session  Assistive device Straight cane;None  Gait Pattern Step-through pattern;Decreased arm swing - right;Decreased step length - right  Ambulation Surface Level;Indoor  High Level Balance  High Level Balance Activities Side stepping;Marching forwards;Marching backwards;Tandem walking (tandem gait fwd/bwd)  High Level Balance Comments on blue mat in parallel bars: 4 laps each/each way with light UE support on bars, cues on posture and ex form/technique. Min guard assist for balance.  Knee/Hip Exercises: Aerobic  Nustep UE/LE's on level 2.0 x 8 minutes with goal >/=55 steps per minute for strengthening and activity tolerance.      12/07/20 0832  Balance Exercises: Standing  Standing Eyes Opened Wide (BOA);Foam/compliant surface;Other reps (comment);Limitations  Standing Eyes Opened Limitations on airex with light fingertip support on sturdy surface- heel<>toe raise, alternating marching, and mini squats for 10 reps each. cues on form and technique. Min guard assist.  Standing Eyes Closed Wide (BOA);Narrow base of support (BOS);Head turns;Foam/compliant surface;Other reps (comment);Limitations  Standing Eyes Closed Limitations on airex with no  UE support: feet hip width apart EC 30 sec's x 3 reps, progressing to feet wider apart for EC head movements left<>right, up<>down for ~10 reps. min guard to min assist with occasional touch to sturdy surface for balance assistance.  Partial Tandem Stance Eyes closed;Foam/compliant surface;2  reps;30 secs;Limitations  Partial Tandem Stance Limitations on airex with no UE support for 2 reps each foot forward with up to min assist for balance          PT Short Term Goals - 11/30/20 1208      PT SHORT TERM GOAL #1   Title Patient will be independent with inital HEP for strengthening/balance (All STGs due: 12/12/20)    Baseline no HEP established    Time 3    Period Weeks    Status New    Target Date 12/12/20      PT SHORT TERM GOAL #2   Title Patient will improve 5x sit <> stand to </= 18 seconds to demonstrate improved balance and functional strength    Baseline 22.25 secs    Time 3    Period Weeks    Status New      PT SHORT TERM GOAL #3   Title Berg Balance to be assessed and LTG to be set as appropriate    Baseline 48/56    Time 3    Period Weeks    Status Achieved             PT Long Term Goals - 11/30/20 1208      PT LONG TERM GOAL #1   Title Patient will be independent with final HEP for strengthening/balance (All LTGs Due:01/02/21)    Baseline no HEP established    Time 6    Period Weeks    Status New      PT LONG TERM GOAL #2   Title Pt will improve BERG score to at least a 52/56 in order to demo decr fall risk.    Baseline 48/56    Time 6    Period Weeks    Status Revised      PT LONG TERM GOAL #3   Title Patient will improve TUG to </= 12 seconds to demosntrate reduced fall risk    Baseline 16.12 secs    Time 6    Period Weeks    Status New      PT LONG TERM GOAL #4   Title Patient will improve 5x sit <> stand to </= 15 seconds to demonstrate reduced fall risk    Baseline 22.25 secs with UE support    Time 6    Period Weeks    Status New      PT LONG TERM GOAL #5   Title Patient will improve gait speed to >/= 2.0 ft/sec to demonstrate improved community ambulation    Baseline 1.67 ft/sec    Time 6    Period Weeks    Status New      PT LONG TERM GOAL #6   Title Patient will demonstrate ability to ascend/descend 8 stairs with  single rail and reciprocal pattern for improved household entry/exit    Baseline rail + SPC step to pattern, close CGA    Time 6    Period Weeks    Status New             12/07/20 0808  Plan  Clinical Impression Statement Today's skilled session continued to focus on strengthening and balance with no issues noted or reported  in session. The pt is progressing toward goals and should benefit from continued PT to progress toward unmet goals.  Personal Factors and Comorbidities Comorbidity 3+;Time since onset of injury/illness/exacerbation  Comorbidities LBP, PAC, Panic Attacks, Depression, MS  Examination-Activity Limitations Bed Mobility;Transfers;Squat;Stairs;Stand;Locomotion Level  Examination-Participation Restrictions Occupation;Cleaning;Community Activity  Pt will benefit from skilled therapeutic intervention in order to improve on the following deficits Abnormal gait;Decreased balance;Decreased endurance;Decreased mobility;Difficulty walking;Increased muscle spasms;Impaired sensation;Decreased range of motion;Decreased activity tolerance;Decreased knowledge of use of DME;Decreased strength;Impaired flexibility;Pain  Stability/Clinical Decision Making Evolving/Moderate complexity  Rehab Potential Good  PT Frequency 2x / week  PT Duration 6 weeks  PT Treatment/Interventions ADLs/Self Care Home Management;Aquatic Therapy;Cryotherapy;Moist Heat;DME Instruction;Gait training;Stair training;Functional mobility training;Therapeutic activities;Therapeutic exercise;Balance training;Neuromuscular re-education;Patient/family education;Orthotic Fit/Training;Manual techniques;Passive range of motion;Vestibular;Electrical Stimulation  PT Next Visit Plan Patient enjoyed SciFit, completed as warm up. Continue balance activites without UE support. Continue BLE strengthening.  Consulted and Agree with Plan of Care Patient          Patient will benefit from skilled therapeutic intervention in order  to improve the following deficits and impairments:  Abnormal gait,Decreased balance,Decreased endurance,Decreased mobility,Difficulty walking,Increased muscle spasms,Impaired sensation,Decreased range of motion,Decreased activity tolerance,Decreased knowledge of use of DME,Decreased strength,Impaired flexibility,Pain  Visit Diagnosis: Other abnormalities of gait and mobility  Difficulty in walking, not elsewhere classified  Unsteadiness on feet  Muscle weakness (generalized)     Problem List Patient Active Problem List   Diagnosis Date Noted  . Anxiety 12/16/2017  . Balance problem 02/27/2016  . Paresthesia 02/27/2016   Willow Ora, PTA, [facility] [address], [place], [place] [address] [phone] 12/08/20, 6:13 PM   Name: [name] MRN: [id number] Date of Birth: 08-12-1966

## 2020-12-12 ENCOUNTER — Other Ambulatory Visit: Payer: Self-pay

## 2020-12-12 ENCOUNTER — Encounter: Payer: Self-pay | Admitting: Physical Therapy

## 2020-12-12 ENCOUNTER — Ambulatory Visit: Payer: 59 | Admitting: Physical Therapy

## 2020-12-12 DIAGNOSIS — M6281 Muscle weakness (generalized): Secondary | ICD-10-CM

## 2020-12-12 DIAGNOSIS — R2681 Unsteadiness on feet: Secondary | ICD-10-CM

## 2020-12-12 DIAGNOSIS — R262 Difficulty in walking, not elsewhere classified: Secondary | ICD-10-CM

## 2020-12-12 DIAGNOSIS — R2689 Other abnormalities of gait and mobility: Secondary | ICD-10-CM | POA: Diagnosis not present

## 2020-12-13 NOTE — Therapy (Signed)
[facility] [address] [place], [place], [address] Phone: [phone]   Fax:  [phone]  Physical Therapy Treatment  Patient Details  Name: [name] MRN: [id number] Date of Birth: [birth year] Referring Provider (PT): Metta Clines, DO   Encounter Date: 12/12/2020   PT End of Session - 12/12/20 0852    Visit Number 5    Number of Visits 13    Date for PT Re-Evaluation 02/19/21   POC for 6 weeks, Cert for 90 days   Authorization Type UHC (VL: 60)    PT Start Time 0849    PT Stop Time 0928    PT Time Calculation (min) 39 min    Equipment Utilized During Treatment Gait belt    Activity Tolerance Patient tolerated treatment well    Behavior During Therapy [facility] for tasks assessed/performed           Past Medical History:  Diagnosis Date  . Depression   . Heart murmur    as a child  . Hypercholesteremia   . Low back pain   . MS (multiple sclerosis) ([place])   . Neuromuscular disorder ([place])    MS dx 04/2020  . Numbness and tingling 2017   Feet  . PAC (premature atrial contraction) 1998  . Panic attacks     Past Surgical History:  Procedure Laterality Date  . ABDOMINAL HYSTERECTOMY  2007  . cesarian     1 time  . COLONOSCOPY  10/15/2020   [place]  . fibriod removal     uterine    There were no vitals filed for this visit.   Subjective Assessment - 12/12/20 0850    Subjective No new complaints. No falls or pain to report. HEP is going well, still challenging.    Pertinent History LBP, PAC, Panic Attacks, Depression    Limitations Standing;Walking    Diagnostic tests MRI of the cervical spine without contrast on 02/25/2020 which demonstrated multiple hyperintense lesions throughout the cervical and upper thoracic cord.  Follow up MRI of brain with and without contrast on 03/31/2020 showed multiple T2 hyperintense lesions involving the periventricular, subcortical and juxtacortical white matter  with involvement of the callososeptal interface and brainstem, no enhancemen    Patient Stated Goals learn how to get herself stronger;    Currently in Pain? No/denies              [facility] PT Assessment - 12/12/20 0852      Timed Up and Go Test   TUG Normal TUG    Normal TUG (seconds) 13    TUG Comments with cane, supervision                [facility] Adult PT Treatment/Exercise - 12/12/20 0852      Transfers   Transfers Sit to Stand;Stand to Sit    Sit to Stand 5: Supervision    Five time sit to stand comments  14.40 sec's no UE (hands on knees) using standard height surface    Stand to Sit 5: Supervision      Ambulation/Gait   Ambulation/Gait Yes    Ambulation/Gait Assistance 5: Supervision;4: Min guard    Ambulation/Gait Assistance Details around gym with session with use of cane with testing and to enter/exit session.    Assistive device Straight cane;None    Gait Pattern Step-through pattern;Decreased arm swing - right;Decreased step length - right    Ambulation Surface Level;Indoor    Gait velocity 14.85 sec's= 2.21  ft/sec with cane      Self-Care   Self-Care Other Self-Care Comments    Other Self-Care Comments  reviewed current HEP. No issues reported or noted. Current program remains challenging and appropriate.      Knee/Hip Exercises: Aerobic   Nustep UE/LE's on level 3.0 x 8 minutes with goal >/=55 steps per minute for strengthening and activity tolerance.               Balance Exercises - 12/12/20 0910      Balance Exercises: Standing   Standing Eyes Closed Wide (BOA);Narrow base of support (BOS);Head turns;Foam/compliant surface;Other reps (comment);Limitations    Standing Eyes Closed Limitations on  with no UE support: feet hip width apart EC 30 sec's x 3 reps, progressing to feet wider apart for EC head movements left<>right, up<>down for ~10 reps. min guard to min assist for balance. posterior balance loss preference noted with cues on weight  shifting to correct.    Partial Tandem Stance Eyes closed;Foam/compliant surface;30 secs;Limitations;3 reps    Partial Tandem Stance Limitations on airex with no UE support for 3 reps each foot forward with up to min assist for balance. cues for posture and weight shifting to assist with balance.               PT Short Term Goals - 12/12/20 1730      PT SHORT TERM GOAL #1   Title Patient will be independent with inital HEP for strengthening/balance (All STGs due: 12/12/20)    Baseline 12/12/20: met with current HEP    Status Achieved    Target Date 12/12/20      PT SHORT TERM GOAL #2   Title Patient will improve 5x sit <> stand to </= 18 seconds to demonstrate improved balance and functional strength    Baseline 12/12/20: 14.40 sec's no UE support from standard height surface    Status Achieved      PT SHORT TERM GOAL #3   Title Berg Balance to be assessed and LTG to be set as appropriate    Baseline 48/56 was initial score    Status Achieved             PT Long Term Goals - 12/13/20 1414      PT LONG TERM GOAL #1   Title Patient will be independent with final HEP for strengthening/balance (All LTGs Due:01/02/21)    Baseline no HEP established    Time 6    Period Weeks    Status On-going      PT LONG TERM GOAL #2   Title Pt will improve BERG score to at least a 52/56 in order to demo decr fall risk.    Baseline 48/56    Time 6    Period Weeks    Status On-going      PT LONG TERM GOAL #3   Title Patient will improve TUG to </= 12 seconds to demosntrate reduced fall risk    Baseline 12/12/20: 13 sec's with cane    Time 6    Period Weeks    Status On-going      PT LONG TERM GOAL #4   Title Patient will improve 5x sit <> stand to </= 15 seconds to demonstrate reduced fall risk    Baseline 12/12/20: 14.40 sec's no UE support from standard height surface    Status Achieved      PT LONG TERM GOAL #5   Title Patient will improve gait speed to >/=  2.0 ft/sec to  demonstrate improved community ambulation    Baseline 12/12/20: 2.21 ft/sec    Status Achieved      PT LONG TERM GOAL #6   Title Patient will demonstrate ability to ascend/descend 8 stairs with single rail and reciprocal pattern for improved household entry/exit    Baseline rail + SPC step to pattern, close CGA    Time 6    Period Weeks    Status On-going                 Plan - 12/12/20 8786    Clinical Impression Statement Today's skilled session initially addressed progress toward STGs with all goals met. Pt has improved her 5 time sit to stand time to 14.40 sec's no UE support meeting her LTG as well. The pt also improved her gait speed to 2.21 f/tsec with cane, also meeting the LTG. Remainder of LTGs are ongoing. Remainder of session continued to address strengthening and balance training with no issues noted or reported in session. The pt should benefit from continued PT to progress toward unmet goals.    Personal Factors and Comorbidities Comorbidity 3+;Time since onset of injury/illness/exacerbation    Comorbidities LBP, PAC, Panic Attacks, Depression, MS    Examination-Activity Limitations Bed Mobility;Transfers;Squat;Stairs;Stand;Locomotion Level    Examination-Participation Restrictions Occupation;Cleaning;Community Activity    Stability/Clinical Decision Making Evolving/Moderate complexity    Rehab Potential Good    PT Frequency 2x / week    PT Duration 6 weeks    PT Treatment/Interventions ADLs/Self Care Home Management;Aquatic Therapy;Cryotherapy;Moist Heat;DME Instruction;Gait training;Stair training;Functional mobility training;Therapeutic activities;Therapeutic exercise;Balance training;Neuromuscular re-education;Patient/family education;Orthotic Fit/Training;Manual techniques;Passive range of motion;Vestibular;Electrical Stimulation    PT Next Visit Plan Patient enjoyed SciFit, completed as warm up. Continue balance activites without UE support. Continue BLE  strengthening.    Consulted and Agree with Plan of Care Patient           Patient will benefit from skilled therapeutic intervention in order to improve the following deficits and impairments:  Abnormal gait,Decreased balance,Decreased endurance,Decreased mobility,Difficulty walking,Increased muscle spasms,Impaired sensation,Decreased range of motion,Decreased activity tolerance,Decreased knowledge of use of DME,Decreased strength,Impaired flexibility,Pain  Visit Diagnosis: Other abnormalities of gait and mobility  Difficulty in walking, not elsewhere classified  Unsteadiness on feet  Muscle weakness (generalized)     Problem List Patient Active Problem List   Diagnosis Date Noted  . Anxiety 12/16/2017  . Balance problem 02/27/2016  . Paresthesia 02/27/2016    Willow Ora, PTA, [facility] Outpatient Neuro [facility] [address], [place], [place] [address] [phone] 12/13/20, 2:19 PM   Name: ALESSA MAZUR MRN: [id number] Date of Birth: 02/02/1966

## 2020-12-14 ENCOUNTER — Encounter: Payer: Self-pay | Admitting: Physical Therapy

## 2020-12-14 ENCOUNTER — Ambulatory Visit: Payer: 59 | Admitting: Physical Therapy

## 2020-12-14 ENCOUNTER — Other Ambulatory Visit: Payer: Self-pay

## 2020-12-14 DIAGNOSIS — R2681 Unsteadiness on feet: Secondary | ICD-10-CM

## 2020-12-14 DIAGNOSIS — M6281 Muscle weakness (generalized): Secondary | ICD-10-CM

## 2020-12-14 DIAGNOSIS — R2689 Other abnormalities of gait and mobility: Secondary | ICD-10-CM | POA: Diagnosis not present

## 2020-12-14 DIAGNOSIS — R262 Difficulty in walking, not elsewhere classified: Secondary | ICD-10-CM

## 2020-12-17 NOTE — Therapy (Signed)
[facility] [address] [place], [place], [address] Phone: [phone]   Fax:  [phone]  Physical Therapy Treatment  Patient Details  Name: [name] MRN: [id number] Date of Birth: Oct 31, 1965 Referring Provider (PT): Metta Clines, DO   Encounter Date: 12/14/2020     12/14/20 0806  PT Visits / Re-Eval  Visit Number 6  Number of Visits 13  Date for PT Re-Evaluation 02/19/21 (POC for 6 weeks, Cert for 90 days)  Authorization  Authorization Type UHC (VL: 60)  PT Time Calculation  PT Start Time 0803  PT Stop Time 0842  PT Time Calculation (min) 39 min  PT - End of Session  Equipment Utilized During Treatment Gait belt  Activity Tolerance Patient tolerated treatment well  Behavior During Therapy [facility] for tasks assessed/performed    Past Medical History:  Diagnosis Date  . Depression   . Heart murmur    as a child  . Hypercholesteremia   . Low back pain   . MS (multiple sclerosis) ([place])   . Neuromuscular disorder ([place])    MS dx 04/2020  . Numbness and tingling 2017   Feet  . PAC (premature atrial contraction) 1998  . Panic attacks     Past Surgical History:  Procedure Laterality Date  . ABDOMINAL HYSTERECTOMY  2007  . cesarian     1 time  . COLONOSCOPY  10/15/2020   [place]  . fibriod removal     uterine    There were no vitals filed for this visit.     12/14/20 0806  Symptoms/Limitations  Subjective No new complaints. No falls or pain.  Pertinent History LBP, PAC, Panic Attacks, Depression  Limitations Standing;Walking  How long can you walk comfortably? 1 hr.  Diagnostic tests MRI of the cervical spine without contrast on 02/25/2020 which demonstrated multiple hyperintense lesions throughout the cervical and upper thoracic cord.  Follow up MRI of brain with and without contrast on 03/31/2020 showed multiple T2 hyperintense lesions involving the periventricular, subcortical and  juxtacortical white matter with involvement of the callososeptal interface and brainstem, no enhancemen  Patient Stated Goals learn how to get herself stronger;  Pain Assessment  Currently in Pain? No/denies      12/14/20 0807  Transfers  Transfers Sit to Stand;Stand to Sit  Sit to Stand 5: Supervision  Stand to Sit 5: Supervision  Ambulation/Gait  Ambulation/Gait Yes  Ambulation/Gait Assistance 5: Supervision;4: Min guard  Ambulation/Gait Assistance Details around gym with session  Assistive device Straight cane;None  Gait Pattern Step-through pattern;Decreased arm swing - right;Decreased step length - right  Ambulation Surface Level;Indoor  Knee/Hip Exercises: Aerobic  Nustep UE/LE's on level 3.0 x 8 minutes with goal >/=55 steps per minute for strengthening and activity tolerance.  Knee/Hip Exercises: Standing  Forward Step Up Both;1 set;10 reps;Hand Hold: 2;Step Height: 6";Limitations  Lateral Step Up Both;1 set;10 reps;Hand Hold: 2;Step Height: 6";Limitations  Lateral Step Up Limitations with contralateral march to stance LE. cues on form/technique  Forward Step Up Limitations wtih contralateral march to stance LE, cues on form/technique.       12/14/20 0831  Balance Exercises: Standing  Standing Eyes Closed Wide (BOA);Foam/compliant surface;3 reps;30 secs;Limitations  Standing Eyes Closed Limitations standing across blue foam beam: feet hip width apart for EC 30 sec's x 3 reps. min guard assist to min assist for balance with cues on posture/weight shifitng to assist with balance.  Balance Beam standing across blue foam beam: alternating forward stepping  to floor/back onto the beam, then alternating backward stepping to floor/back onto beam for ~10 reps each side. cues for increased step lenght/height and weight shifting. light UE support on bars with min guard assist for safety.  Tandem Gait Forward;Retro;Upper extremity support;4 reps;Limitations  Tandem Gait Limitations on  blue foam beam with light UE support on bars, cues for step placement on beam.  Sidestepping Foam/compliant support;Upper extremity support;4 reps;Limitations  Sidestepping Limitations on blue foam beam with light UE support, cues on step height/placement and step length. Min guard assist for safety.          PT Short Term Goals - 12/12/20 1730      PT SHORT TERM GOAL #1   Title Patient will be independent with inital HEP for strengthening/balance (All STGs due: 12/12/20)    Baseline 12/12/20: met with current HEP    Status Achieved    Target Date 12/12/20      PT SHORT TERM GOAL #2   Title Patient will improve 5x sit <> stand to </= 18 seconds to demonstrate improved balance and functional strength    Baseline 12/12/20: 14.40 sec's no UE support from standard height surface    Status Achieved      PT SHORT TERM GOAL #3   Title Berg Balance to be assessed and LTG to be set as appropriate    Baseline 48/56 was initial score    Status Achieved             PT Long Term Goals - 12/13/20 1414      PT LONG TERM GOAL #1   Title Patient will be independent with final HEP for strengthening/balance (All LTGs Due:01/02/21)    Baseline no HEP established    Time 6    Period Weeks    Status On-going      PT LONG TERM GOAL #2   Title Pt will improve BERG score to at least a 52/56 in order to demo decr fall risk.    Baseline 48/56    Time 6    Period Weeks    Status On-going      PT LONG TERM GOAL #3   Title Patient will improve TUG to </= 12 seconds to demosntrate reduced fall risk    Baseline 12/12/20: 13 sec's with cane    Time 6    Period Weeks    Status On-going      PT LONG TERM GOAL #4   Title Patient will improve 5x sit <> stand to </= 15 seconds to demonstrate reduced fall risk    Baseline 12/12/20: 14.40 sec's no UE support from standard height surface    Status Achieved      PT LONG TERM GOAL #5   Title Patient will improve gait speed to >/= 2.0 ft/sec to  demonstrate improved community ambulation    Baseline 12/12/20: 2.21 ft/sec    Status Achieved      PT LONG TERM GOAL #6   Title Patient will demonstrate ability to ascend/descend 8 stairs with single rail and reciprocal pattern for improved household entry/exit    Baseline rail + SPC step to pattern, close CGA    Time 6    Period Weeks    Status On-going             12/14/20 0806  Plan  Clinical Impression Statement Today's skilled session continued to focus on strengthening and balance training on compliant surfaces with no issues noted or reported in session. The  pt is progressing toward goals and should benefit from continued PT to progress toward unmet goals.  Personal Factors and Comorbidities Comorbidity 3+;Time since onset of injury/illness/exacerbation  Comorbidities LBP, PAC, Panic Attacks, Depression, MS  Examination-Activity Limitations Bed Mobility;Transfers;Squat;Stairs;Stand;Locomotion Level  Examination-Participation Restrictions Occupation;Cleaning;Community Activity  Pt will benefit from skilled therapeutic intervention in order to improve on the following deficits Abnormal gait;Decreased balance;Decreased endurance;Decreased mobility;Difficulty walking;Increased muscle spasms;Impaired sensation;Decreased range of motion;Decreased activity tolerance;Decreased knowledge of use of DME;Decreased strength;Impaired flexibility;Pain  Stability/Clinical Decision Making Evolving/Moderate complexity  Rehab Potential Good  PT Frequency 2x / week  PT Duration 6 weeks  PT Treatment/Interventions ADLs/Self Care Home Management;Aquatic Therapy;Cryotherapy;Moist Heat;DME Instruction;Gait training;Stair training;Functional mobility training;Therapeutic activities;Therapeutic exercise;Balance training;Neuromuscular re-education;Patient/family education;Orthotic Fit/Training;Manual techniques;Passive range of motion;Vestibular;Electrical Stimulation  PT Next Visit Plan Patient enjoyed  SciFit, completed as warm up. Continue balance activites without UE support. Continue BLE strengthening.  Consulted and Agree with Plan of Care Patient          Patient will benefit from skilled therapeutic intervention in order to improve the following deficits and impairments:  Abnormal gait,Decreased balance,Decreased endurance,Decreased mobility,Difficulty walking,Increased muscle spasms,Impaired sensation,Decreased range of motion,Decreased activity tolerance,Decreased knowledge of use of DME,Decreased strength,Impaired flexibility,Pain  Visit Diagnosis: Other abnormalities of gait and mobility  Difficulty in walking, not elsewhere classified  Unsteadiness on feet  Muscle weakness (generalized)     Problem List Patient Active Problem List   Diagnosis Date Noted  . Anxiety 12/16/2017  . Balance problem 02/27/2016  . Paresthesia 02/27/2016    Willow Ora, PTA, [facility] Outpatient Neuro [facility] [address] [place], [place] [address] [phone] 12/17/20, 9:00 PM   Name: [name] MRN: [id number] Date of Birth: July 12, 1966

## 2020-12-19 ENCOUNTER — Other Ambulatory Visit: Payer: Self-pay

## 2020-12-19 ENCOUNTER — Ambulatory Visit: Payer: 59

## 2020-12-19 DIAGNOSIS — R2689 Other abnormalities of gait and mobility: Secondary | ICD-10-CM | POA: Diagnosis not present

## 2020-12-19 DIAGNOSIS — M6281 Muscle weakness (generalized): Secondary | ICD-10-CM

## 2020-12-19 DIAGNOSIS — R2681 Unsteadiness on feet: Secondary | ICD-10-CM

## 2020-12-19 DIAGNOSIS — R262 Difficulty in walking, not elsewhere classified: Secondary | ICD-10-CM

## 2020-12-19 NOTE — Therapy (Signed)
[facility] [address] [place], [place], [address] Phone: [phone]   Fax:  [phone]  Physical Therapy Treatment  Patient Details  Name: [name] MRN: [id number] Date of Birth: 1966/08/04 Referring Provider (PT): Metta Clines, DO   Encounter Date: 12/19/2020   PT End of Session - 12/19/20 0901    Visit Number 7    Number of Visits 13    Date for PT Re-Evaluation 02/19/21   POC for 6 weeks, Cert for 90 days   Authorization Type U[employer] (VL: 60)    PT Start Time 0900    PT Stop Time 0930    PT Time Calculation (min) 30 min    Equipment Utilized During Treatment Gait belt    Activity Tolerance Patient tolerated treatment well    Behavior During Therapy [facility] for tasks assessed/performed           Past Medical History:  Diagnosis Date  . Depression   . Heart murmur    as a child  . Hypercholesteremia   . Low back pain   . MS (multiple sclerosis) ([place])   . Neuromuscular disorder ([place])    MS dx 04/2020  . Numbness and tingling 2017   Feet  . PAC (premature atrial contraction) 1998  . Panic attacks     Past Surgical History:  Procedure Laterality Date  . ABDOMINAL HYSTERECTOMY  2007  . cesarian     1 time  . COLONOSCOPY  10/15/2020   [place]  . fibriod removal     uterine    There were no vitals filed for this visit.   Subjective Assessment - 12/19/20 0901    Subjective No new changes/complaints. No falls.    Pertinent History LBP, PAC, Panic Attacks, Depression    Limitations Standing;Walking    How long can you walk comfortably? 1 hr.    Diagnostic tests MRI of the cervical spine without contrast on 02/25/2020 which demonstrated multiple hyperintense lesions throughout the cervical and upper thoracic cord.  Follow up MRI of brain with and without contrast on 03/31/2020 showed multiple T2 hyperintense lesions involving the periventricular, subcortical and juxtacortical white matter with  involvement of the callososeptal interface and brainstem, no enhancemen    Patient Stated Goals learn how to get herself stronger;    Currently in Pain? No/denies             [facility] Adult PT Treatment/Exercise - 12/19/20 0001      Ambulation/Gait   Ambulation/Gait Yes    Ambulation/Gait Assistance 5: Supervision;4: Min guard    Ambulation/Gait Assistance Details completed ambulation x 230 ft without AD. verbal cues for improved step length    Ambulation Distance (Feet) 230 Feet    Assistive device None    Gait Pattern Step-through pattern;Decreased arm swing - right;Decreased step length - right    Ambulation Surface Level;Indoor      High Level Balance   High Level Balance Activities Head turns    High Level Balance Comments Completed horizontal head turns with ambulation x 115 ft, CGA with completion.            Balance Exercises - 12/19/20 0001      Balance Exercises: Standing   Standing Eyes Opened Wide (BOA);Foam/compliant surface;Other reps (comment);Limitations;Head turns    Standing Eyes Opened Limitations completed horizontal/vertical head turns x 10 reps each direction with intermittent touch    Standing Eyes Closed Wide (BOA);Foam/compliant surface;3 reps;30 secs;Limitations    Tandem  Stance Eyes open;Intermittent upper extremity support;30 secs;Limitations    Tandem Stance Time completed eyes open x 30 seconds each; progressed to addition of horizontal head turns x 10 reps each direction.    SLS with Vectors Foam/compliant surface;Intermittent upper extremity assist;Limitations    SLS with Vectors Limitations completed standing on airex completed alternating toe taps to 6" steps completed x 15 reps bilaterally. started initialyl with UE support progressed to no UE support.    Stepping Strategy Posterior;Foam/compliant surface;Limitations    Stepping Strategy Limitations standing on airex with intermittent UE support completed posterior stepping x 15 reps bilaterally.  increased challenge without UE support.    Tandem Gait Forward;Intermittent upper extremity support;3 reps    Tandem Gait Limitations without UE support on firm surface             PT Short Term Goals - 12/12/20 1730      PT SHORT TERM GOAL #1   Title Patient will be independent with inital HEP for strengthening/balance (All STGs due: 12/12/20)    Baseline 12/12/20: met with current HEP    Status Achieved    Target Date 12/12/20      PT SHORT TERM GOAL #2   Title Patient will improve 5x sit <> stand to </= 18 seconds to demonstrate improved balance and functional strength    Baseline 12/12/20: 14.40 sec's no UE support from standard height surface    Status Achieved      PT SHORT TERM GOAL #3   Title Berg Balance to be assessed and LTG to be set as appropriate    Baseline 48/56 was initial score    Status Achieved             PT Long Term Goals - 12/13/20 1414      PT LONG TERM GOAL #1   Title Patient will be independent with final HEP for strengthening/balance (All LTGs Due:01/02/21)    Baseline no HEP established    Time 6    Period Weeks    Status On-going      PT LONG TERM GOAL #2   Title Pt will improve BERG score to at least a 52/56 in order to demo decr fall risk.    Baseline 48/56    Time 6    Period Weeks    Status On-going      PT LONG TERM GOAL #3   Title Patient will improve TUG to </= 12 seconds to demosntrate reduced fall risk    Baseline 12/12/20: 13 sec's with cane    Time 6    Period Weeks    Status On-going      PT LONG TERM GOAL #4   Title Patient will improve 5x sit <> stand to </= 15 seconds to demonstrate reduced fall risk    Baseline 12/12/20: 14.40 sec's no UE support from standard height surface    Status Achieved      PT LONG TERM GOAL #5   Title Patient will improve gait speed to >/= 2.0 ft/sec to demonstrate improved community ambulation    Baseline 12/12/20: 2.21 ft/sec    Status Achieved      PT LONG TERM GOAL #6   Title Patient  will demonstrate ability to ascend/descend 8 stairs with single rail and reciprocal pattern for improved household entry/exit    Baseline rail + SPC step to pattern, close CGA    Time 6    Period Weeks    Status On-going  Plan - 12/19/20 0902    Clinical Impression Statement Today's session focused on continued balance activites progressing to patient's tolerance. Focus on gait training without AD and working toward improved balance with dynamic gait including head turns. Will continue to progress toward all LTGs.    Personal Factors and Comorbidities Comorbidity 3+;Time since onset of injury/illness/exacerbation    Comorbidities LBP, PAC, Panic Attacks, Depression, MS    Examination-Activity Limitations Bed Mobility;Transfers;Squat;Stairs;Stand;Locomotion Level    Examination-Participation Restrictions Occupation;Cleaning;Community Activity    Stability/Clinical Decision Making Evolving/Moderate complexity    Rehab Potential Good    PT Frequency 2x / week    PT Duration 6 weeks    PT Treatment/Interventions ADLs/Self Care Home Management;Aquatic Therapy;Cryotherapy;Moist Heat;DME Instruction;Gait training;Stair training;Functional mobility training;Therapeutic activities;Therapeutic exercise;Balance training;Neuromuscular re-education;Patient/family education;Orthotic Fit/Training;Manual techniques;Passive range of motion;Vestibular;Electrical Stimulation    PT Next Visit Plan Patient enjoyed SciFit, completed as warm up. Continue balance activites without UE support. Continue BLE strengthening.    Consulted and Agree with Plan of Care Patient           Patient will benefit from skilled therapeutic intervention in order to improve the following deficits and impairments:  Abnormal gait,Decreased balance,Decreased endurance,Decreased mobility,Difficulty walking,Increased muscle spasms,Impaired sensation,Decreased range of motion,Decreased activity tolerance,Decreased  knowledge of use of DME,Decreased strength,Impaired flexibility,Pain  Visit Diagnosis: Other abnormalities of gait and mobility  Difficulty in walking, not elsewhere classified  Unsteadiness on feet  Muscle weakness (generalized)     Problem List Patient Active Problem List   Diagnosis Date Noted  . Anxiety 12/16/2017  . Balance problem 02/27/2016  . Paresthesia 02/27/2016    Jones Bales, PT, DPT 12/19/2020, 10:28 AM  [facility] [address] [place], [place], [address] Phone: [phone]   Fax:  [phone]  Name: [name] MRN: [id number] Date of Birth: 15-Oct-1966

## 2020-12-21 ENCOUNTER — Ambulatory Visit: Payer: 59 | Admitting: Physical Therapy

## 2020-12-21 ENCOUNTER — Encounter: Payer: Self-pay | Admitting: Physical Therapy

## 2020-12-21 ENCOUNTER — Other Ambulatory Visit: Payer: Self-pay

## 2020-12-21 DIAGNOSIS — R262 Difficulty in walking, not elsewhere classified: Secondary | ICD-10-CM

## 2020-12-21 DIAGNOSIS — M6281 Muscle weakness (generalized): Secondary | ICD-10-CM

## 2020-12-21 DIAGNOSIS — R2689 Other abnormalities of gait and mobility: Secondary | ICD-10-CM | POA: Diagnosis not present

## 2020-12-21 DIAGNOSIS — R2681 Unsteadiness on feet: Secondary | ICD-10-CM

## 2020-12-21 NOTE — Therapy (Signed)
[facility] [address] [place], [place], [address] Phone: [phone]   Fax:  [phone]  Physical Therapy Treatment  Patient Details  Name: [name] MRN: [id number] Date of Birth: [birth year] Referring Provider (PT): Metta Clines, DO   Encounter Date: 12/21/2020   PT End of Session - 12/21/20 0805    Visit Number 8    Number of Visits 13    Date for PT Re-Evaluation 02/19/21   POC for 6 weeks, Cert for 90 days   Authorization Type UHC (VL: 60)    PT Start Time 0802    PT Stop Time 0845    PT Time Calculation (min) 43 min    Equipment Utilized During Treatment Gait belt    Activity Tolerance Patient tolerated treatment well    Behavior During Therapy WFL for tasks assessed/performed           Past Medical History:  Diagnosis Date  . Depression   . Heart murmur    as a child  . Hypercholesteremia   . Low back pain   . MS (multiple sclerosis) ([place])   . Neuromuscular disorder ([place])    MS dx 04/2020  . Numbness and tingling 2017   Feet  . PAC (premature atrial contraction) 1998  . Panic attacks     Past Surgical History:  Procedure Laterality Date  . ABDOMINAL HYSTERECTOMY  2007  . cesarian     1 time  . COLONOSCOPY  10/15/2020   [place]  . fibriod removal     uterine    There were no vitals filed for this visit.   Subjective Assessment - 12/21/20 0804    Subjective No new complaints. No falls or pain to report. Does feel that she is getting stronger, "I no longer hurt when I get up".    Pertinent History LBP, PAC, Panic Attacks, Depression    Limitations Standing;Walking    How long can you walk comfortably? 1 hr.    Diagnostic tests MRI of the cervical spine without contrast on 02/25/2020 which demonstrated multiple hyperintense lesions throughout the cervical and upper thoracic cord.  Follow up MRI of brain with and without contrast on 03/31/2020 showed multiple T2 hyperintense  lesions involving the periventricular, subcortical and juxtacortical white matter with involvement of the callososeptal interface and brainstem, no enhancemen    Patient Stated Goals learn how to get herself stronger;    Currently in Pain? No/denies                 [facility] Adult PT Treatment/Exercise - 12/21/20 0806      Transfers   Transfers Sit to Stand;Stand to Sit    Sit to Stand 5: Supervision    Stand to Sit 5: Supervision      Ambulation/Gait   Ambulation/Gait Yes    Ambulation/Gait Assistance 5: Supervision;4: Min guard    Ambulation/Gait Assistance Details around gym with session with min guard assist when fatigued after activity.    Assistive device None    Gait Pattern Step-through pattern;Decreased arm swing - right;Decreased step length - right    Ambulation Surface Level;Indoor      High Level Balance   High Level Balance Activities Side stepping;Tandem walking;Marching forwards;Marching backwards    High Level Balance Comments on red/blue mats next to counter for 3 laps each with intermittent touch to counter, min guard to min assist for balance. cues on posture and weight shifting.      Knee/Hip  Exercises: Aerobic   Other Aerobic Completed SciFit on Level 2.0 x 6 minutes with BLE/BUE as warm up for improved motion/strength/activity tolerance.      Knee/Hip Exercises: Standing   Lateral Step Up Both;1 set;10 reps;Hand Hold: 2;Step Height: 6";Limitations    Lateral Step Up Limitations with contralateral march to stance LE. cues on form/technique    Forward Step Up Both;1 set;10 reps;Hand Hold: 2;Step Height: 6";Limitations    Forward Step Up Limitations wtih contralateral march to stance LE, cues on form/technique.               Balance Exercises - 12/21/20 0822      Balance Exercises: Standing   SLS with Vectors Foam/compliant surface;Intermittent upper extremity assist;Limitations    SLS with Vectors Limitations 6 cones along edges of red/blue mats  next to counter: alternating foot taps to each cone with side stepping left<>right for 1 lap each way, intermittent touch to counter vs HHA, min assist.               PT Short Term Goals - 12/12/20 1730      PT SHORT TERM GOAL #1   Title Patient will be independent with inital HEP for strengthening/balance (All STGs due: 12/12/20)    Baseline 12/12/20: met with current HEP    Status Achieved    Target Date 12/12/20      PT SHORT TERM GOAL #2   Title Patient will improve 5x sit <> stand to </= 18 seconds to demonstrate improved balance and functional strength    Baseline 12/12/20: 14.40 sec's no UE support from standard height surface    Status Achieved      PT SHORT TERM GOAL #3   Title Berg Balance to be assessed and LTG to be set as appropriate    Baseline 48/56 was initial score    Status Achieved             PT Long Term Goals - 12/13/20 1414      PT LONG TERM GOAL #1   Title Patient will be independent with final HEP for strengthening/balance (All LTGs Due:01/02/21)    Baseline no HEP established    Time 6    Period Weeks    Status On-going      PT LONG TERM GOAL #2   Title Pt will improve BERG score to at least a 52/56 in order to demo decr fall risk.    Baseline 48/56    Time 6    Period Weeks    Status On-going      PT LONG TERM GOAL #3   Title Patient will improve TUG to </= 12 seconds to demosntrate reduced fall risk    Baseline 12/12/20: 13 sec's with cane    Time 6    Period Weeks    Status On-going      PT LONG TERM GOAL #4   Title Patient will improve 5x sit <> stand to </= 15 seconds to demonstrate reduced fall risk    Baseline 12/12/20: 14.40 sec's no UE support from standard height surface    Status Achieved      PT LONG TERM GOAL #5   Title Patient will improve gait speed to >/= 2.0 ft/sec to demonstrate improved community ambulation    Baseline 12/12/20: 2.21 ft/sec    Status Achieved      PT LONG TERM GOAL #6   Title Patient will  demonstrate ability to ascend/descend 8 stairs with single rail and  reciprocal pattern for improved household entry/exit    Baseline rail + SPC step to pattern, close CGA    Time 6    Period Weeks    Status On-going              Plan - 12/21/20 0805    Clinical Impression Statement Today's skilled session continued to focus on strengthening and balance training on compliant surfaces with no issues noted or reported. The pt is making steady progress toward goals and should benefit from continued PT to progress toward unmet goals.    Personal Factors and Comorbidities Comorbidity 3+;Time since onset of injury/illness/exacerbation    Comorbidities LBP, PAC, Panic Attacks, Depression, MS    Examination-Activity Limitations Bed Mobility;Transfers;Squat;Stairs;Stand;Locomotion Level    Examination-Participation Restrictions Occupation;Cleaning;Community Activity    Stability/Clinical Decision Making Evolving/Moderate complexity    Rehab Potential Good    PT Frequency 2x / week    PT Duration 6 weeks    PT Treatment/Interventions ADLs/Self Care Home Management;Aquatic Therapy;Cryotherapy;Moist Heat;DME Instruction;Gait training;Stair training;Functional mobility training;Therapeutic activities;Therapeutic exercise;Balance training;Neuromuscular re-education;Patient/family education;Orthotic Fit/Training;Manual techniques;Passive range of motion;Vestibular;Electrical Stimulation    PT Next Visit Plan Patient enjoyed SciFit, completed as warm up. Continue balance activites without UE support. Continue BLE strengthening.    Consulted and Agree with Plan of Care Patient           Patient will benefit from skilled therapeutic intervention in order to improve the following deficits and impairments:  Abnormal gait,Decreased balance,Decreased endurance,Decreased mobility,Difficulty walking,Increased muscle spasms,Impaired sensation,Decreased range of motion,Decreased activity tolerance,Decreased  knowledge of use of DME,Decreased strength,Impaired flexibility,Pain  Visit Diagnosis: Other abnormalities of gait and mobility  Difficulty in walking, not elsewhere classified  Unsteadiness on feet  Muscle weakness (generalized)     Problem List Patient Active Problem List   Diagnosis Date Noted  . Anxiety 12/16/2017  . Balance problem 02/27/2016  . Paresthesia 02/27/2016    Willow Ora, PTA, [facility] Outpatient [facility] [address] [place], [place] [address] [phone] 12/21/20, 4:32 PM   Name: [name] MRN: [id number] Date of Birth: Feb 14, 1966

## 2020-12-26 ENCOUNTER — Ambulatory Visit: Payer: 59 | Attending: Neurology

## 2020-12-26 ENCOUNTER — Other Ambulatory Visit: Payer: Self-pay

## 2020-12-26 DIAGNOSIS — R262 Difficulty in walking, not elsewhere classified: Secondary | ICD-10-CM | POA: Insufficient documentation

## 2020-12-26 DIAGNOSIS — R2681 Unsteadiness on feet: Secondary | ICD-10-CM | POA: Insufficient documentation

## 2020-12-26 DIAGNOSIS — M6281 Muscle weakness (generalized): Secondary | ICD-10-CM | POA: Insufficient documentation

## 2020-12-26 DIAGNOSIS — R2689 Other abnormalities of gait and mobility: Secondary | ICD-10-CM | POA: Diagnosis not present

## 2020-12-26 NOTE — Therapy (Signed)
[facility] [address] [place], [place], [address] Phone: [phone]   Fax:  [phone]  Physical Therapy Treatment  Patient Details  Name: [name] MRN: [id number] Date of Birth: [birth year] Referring Provider (PT): Metta Clines, DO   Encounter Date: 12/26/2020   PT End of Session - 12/26/20 0849    Visit Number 9    Number of Visits 13    Date for PT Re-Evaluation 02/19/21   POC for 6 weeks, Cert for 90 days   Authorization Type UHC (VL: 60)    PT Start Time 0847    PT Stop Time 0929    PT Time Calculation (min) 42 min    Equipment Utilized During Treatment Gait belt    Activity Tolerance Patient tolerated treatment well    Behavior During Therapy [facility] for tasks assessed/performed           Past Medical History:  Diagnosis Date  . Depression   . Heart murmur    as a child  . Hypercholesteremia   . Low back pain   . MS (multiple sclerosis) ([place])   . Neuromuscular disorder ([place])    MS dx 04/2020  . Numbness and tingling 2017   Feet  . PAC (premature atrial contraction) 1998  . Panic attacks     Past Surgical History:  Procedure Laterality Date  . ABDOMINAL HYSTERECTOMY  2007  . cesarian     1 time  . COLONOSCOPY  10/15/2020   [place]  . fibriod removal     uterine    There were no vitals filed for this visit.   Subjective Assessment - 12/26/20 0850    Subjective Patient reports her energy level has stayed up and is feeling well. No falls/pain to report.    Pertinent History LBP, PAC, Panic Attacks, Depression    Limitations Standing;Walking    How long can you walk comfortably? 1 hr.    Diagnostic tests MRI of the cervical spine without contrast on 02/25/2020 which demonstrated multiple hyperintense lesions throughout the cervical and upper thoracic cord.  Follow up MRI of brain with and without contrast on 03/31/2020 showed multiple T2 hyperintense lesions involving the  periventricular, subcortical and juxtacortical white matter with involvement of the callososeptal interface and brainstem, no enhancemen    Patient Stated Goals learn how to get herself stronger;    Currently in Pain? No/denies               [facility] Adult PT Treatment/Exercise - 12/26/20 0001      Transfers   Transfers Sit to Stand;Stand to Sit    Sit to Stand 5: Supervision    Stand to Sit 5: Supervision    Comments completed sit <> stand training x 10 reps with BLE placed on airex, close supervision with completion.      Ambulation/Gait   Ambulation/Gait Yes    Ambulation/Gait Assistance 5: Supervision;4: Min guard    Ambulation/Gait Assistance Details around gym with activities and high level balance without AD    Assistive device None    Gait Pattern Step-through pattern;Decreased arm swing - right;Decreased step length - right    Ambulation Surface Level;Indoor      High Level Balance   High Level Balance Activities Sudden stops;Head turns;Backward walking;Direction changes    High Level Balance Comments completed ambulation around therapy gym without AD with dynamic gait activites on commands x 500 ft including horizontal/vertical head turns, sudden stops, backwards walking, stepping  over obstacles, and full body turns/direction turns. intermittent CGA with completion.Patient overall tolerating very well.      Exercises   Exercises Knee/Hip      Knee/Hip Exercises: Aerobic   Nustep Completed NuStep with BUE/LE's on level 4.0 x 8 minutes with goal >/=60 steps per minute for strengthening and activity tolerance.      Knee/Hip Exercises: Standing   Heel Raises Both;1 set;10 reps;3 seconds;Limitations    Heel Raises Limitations completed with light UE support from chair.    Hip Abduction Stengthening;Both;1 set;10 reps;Knee straight;Limitations    Abduction Limitations with light UE support from chair    Hip Extension Stengthening;Both;1 set;10 reps;Knee straight     Extension Limitations with light UE support; cues to maintain knee extension with completion    Other Standing Knee Exercises Completed standing toe raises x 10 reps, increased challenge on RLE > LLE. Completed mini squats without UE support, completed 1 x 10 reps, verbal cues for form.               Balance Exercises - 12/26/20 0001      Balance Exercises: Standing   Standing Eyes Opened Wide (BOA);Foam/compliant surface;Other reps (comment);Limitations;Head turns    Standing Eyes Opened Limitations completed horizontal/vertical head turns x 10 reps each direction without UE support. close supervision and intermittent CGA required.    Standing Eyes Closed Wide (BOA);Foam/compliant surface;3 reps;30 secs;Limitations    Standing Eyes Closed Limitations standing on airex               PT Short Term Goals - 12/12/20 1730      PT SHORT TERM GOAL #1   Title Patient will be independent with inital HEP for strengthening/balance (All STGs due: 12/12/20)    Baseline 12/12/20: met with current HEP    Status Achieved    Target Date 12/12/20      PT SHORT TERM GOAL #2   Title Patient will improve 5x sit <> stand to </= 18 seconds to demonstrate improved balance and functional strength    Baseline 12/12/20: 14.40 sec's no UE support from standard height surface    Status Achieved      PT SHORT TERM GOAL #3   Title Berg Balance to be assessed and LTG to be set as appropriate    Baseline 48/56 was initial score    Status Achieved             PT Long Term Goals - 12/13/20 1414      PT LONG TERM GOAL #1   Title Patient will be independent with final HEP for strengthening/balance (All LTGs Due:01/02/21)    Baseline no HEP established    Time 6    Period Weeks    Status On-going      PT LONG TERM GOAL #2   Title Pt will improve BERG score to at least a 52/56 in order to demo decr fall risk.    Baseline 48/56    Time 6    Period Weeks    Status On-going      PT LONG TERM GOAL  #3   Title Patient will improve TUG to </= 12 seconds to demosntrate reduced fall risk    Baseline 12/12/20: 13 sec's with cane    Time 6    Period Weeks    Status On-going      PT LONG TERM GOAL #4   Title Patient will improve 5x sit <> stand to </= 15 seconds to demonstrate reduced  fall risk    Baseline 12/12/20: 14.40 sec's no UE support from standard height surface    Status Achieved      PT LONG TERM GOAL #5   Title Patient will improve gait speed to >/= 2.0 ft/sec to demonstrate improved community ambulation    Baseline 12/12/20: 2.21 ft/sec    Status Achieved      PT LONG TERM GOAL #6   Title Patient will demonstrate ability to ascend/descend 8 stairs with single rail and reciprocal pattern for improved household entry/exit    Baseline rail + SPC step to pattern, close CGA    Time 6    Period Weeks    Status On-going                 Plan - 12/26/20 0350    Clinical Impression Statement Today's session focused on continued strengthing/balance activites progressing to patient's tolerance. Focus on gait training without AD and working toward improved balance with dynamic gait. Will continue to progress toward all LTGs.    Personal Factors and Comorbidities Comorbidity 3+;Time since onset of injury/illness/exacerbation    Comorbidities LBP, PAC, Panic Attacks, Depression, MS    Examination-Activity Limitations Bed Mobility;Transfers;Squat;Stairs;Stand;Locomotion Level    Examination-Participation Restrictions Occupation;Cleaning;Community Activity    Stability/Clinical Decision Making Evolving/Moderate complexity    Rehab Potential Good    PT Frequency 2x / week    PT Duration 6 weeks    PT Treatment/Interventions ADLs/Self Care Home Management;Aquatic Therapy;Cryotherapy;Moist Heat;DME Instruction;Gait training;Stair training;Functional mobility training;Therapeutic activities;Therapeutic exercise;Balance training;Neuromuscular re-education;Patient/family  education;Orthotic Fit/Training;Manual techniques;Passive range of motion;Vestibular;Electrical Stimulation    PT Next Visit Plan Continue SciFit/NuStep as warmup. Continue balance activites without UE support. Continue BLE standing strengthening.    Consulted and Agree with Plan of Care Patient           Patient will benefit from skilled therapeutic intervention in order to improve the following deficits and impairments:  Abnormal gait,Decreased balance,Decreased endurance,Decreased mobility,Difficulty walking,Increased muscle spasms,Impaired sensation,Decreased range of motion,Decreased activity tolerance,Decreased knowledge of use of DME,Decreased strength,Impaired flexibility,Pain  Visit Diagnosis: Other abnormalities of gait and mobility  Difficulty in walking, not elsewhere classified  Unsteadiness on feet  Muscle weakness (generalized)     Problem List Patient Active Problem List   Diagnosis Date Noted  . Anxiety 12/16/2017  . Balance problem 02/27/2016  . Paresthesia 02/27/2016    Jones Bales, PT, DPT 12/26/2020, 9:29 AM  [facility] [address] [place], [place], [address] Phone: [phone]   Fax:  [phone]  Name: TAURIEL SCRONCE MRN: [id number] Date of Birth: 06-Oct-1966

## 2020-12-28 ENCOUNTER — Ambulatory Visit: Payer: 59

## 2020-12-28 ENCOUNTER — Other Ambulatory Visit: Payer: Self-pay

## 2020-12-28 DIAGNOSIS — R262 Difficulty in walking, not elsewhere classified: Secondary | ICD-10-CM

## 2020-12-28 DIAGNOSIS — R2689 Other abnormalities of gait and mobility: Secondary | ICD-10-CM

## 2020-12-28 DIAGNOSIS — M6281 Muscle weakness (generalized): Secondary | ICD-10-CM

## 2020-12-28 DIAGNOSIS — R2681 Unsteadiness on feet: Secondary | ICD-10-CM

## 2020-12-28 NOTE — Patient Instructions (Signed)
Access Code: [email or web address] URL: [email or web address] Date: 12/28/2020 Prepared by: Baldomero Lamy  Exercises Sit to Stand - 2 x daily - 4 x weekly - 2 sets - 5 reps Romberg Stance - 2 x daily - 4 x weekly - 2 sets - 10 reps Alternating Step Taps with Counter Support - 2 x daily - 4 x weekly - 2 sets - 10 reps Supine Bridge with Resistance Band - 2 x daily - 4 x weekly - 1 sets - 10 reps Clamshell with Resistance - 2 x daily - 4 x weekly - 1 sets - 10 reps Romberg Stance with Eyes Closed - 1 x daily - 4 x weekly - 1 sets - 3 reps - 30 hold

## 2020-12-28 NOTE — Therapy (Signed)
[facility] [address] [place], [place], [address] Phone: [phone]   Fax:  [phone]  Physical Therapy Treatment  Patient Details  Name: [name] MRN: [id number] Date of Birth: 1965/11/08 Referring Provider (PT): Metta Clines, DO   Encounter Date: 12/28/2020   PT End of Session - 12/28/20 0758    Visit Number 10    Number of Visits 13    Date for PT Re-Evaluation 02/19/21   POC for 6 weeks, Cert for 90 days   Authorization Type UHC (VL: 60)    PT Start Time 0758    PT Stop Time 0843    PT Time Calculation (min) 45 min    Equipment Utilized During Treatment Gait belt    Activity Tolerance Patient tolerated treatment well    Behavior During Therapy [facility] for tasks assessed/performed           Past Medical History:  Diagnosis Date  . Depression   . Heart murmur    as a child  . Hypercholesteremia   . Low back pain   . MS (multiple sclerosis) ([place])   . Neuromuscular disorder ([place])    MS dx 04/2020  . Numbness and tingling 2017   Feet  . PAC (premature atrial contraction) 1998  . Panic attacks     Past Surgical History:  Procedure Laterality Date  . ABDOMINAL HYSTERECTOMY  2007  . cesarian     1 time  . COLONOSCOPY  10/15/2020   [place]  . fibriod removal     uterine    There were no vitals filed for this visit.   Subjective Assessment - 12/28/20 0801    Subjective No new changes/complaints. No pain. No falls.    Pertinent History LBP, PAC, Panic Attacks, Depression    Limitations Standing;Walking    How long can you walk comfortably? 1 hr.    Diagnostic tests MRI of the cervical spine without contrast on 02/25/2020 which demonstrated multiple hyperintense lesions throughout the cervical and upper thoracic cord.  Follow up MRI of brain with and without contrast on 03/31/2020 showed multiple T2 hyperintense lesions involving the periventricular, subcortical and juxtacortical white  matter with involvement of the callososeptal interface and brainstem, no enhancemen    Patient Stated Goals learn how to get herself stronger;    Currently in Pain? No/denies             [facility] Adult PT Treatment/Exercise - 12/28/20 0001      Transfers   Transfers Sit to Stand;Stand to Sit    Sit to Stand 5: Supervision    Stand to Sit 5: Supervision    Comments progressed to completing sit <> stands with red balance beam under BLE 2 sets x 10 reps. Intermittent CGA required.      Ambulation/Gait   Ambulation/Gait Yes    Ambulation/Gait Assistance 5: Supervision;4: Min guard    Ambulation/Gait Assistance Details around therapy gym with high level balance activities    Assistive device None    Gait Pattern Step-through pattern;Decreased arm swing - right;Decreased step length - right    Ambulation Surface Level;Indoor      High Level Balance   High Level Balance Activities Negotiating over obstacles;Marching forwards    High Level Balance Comments Completed obstacle course around therapy gyn with obstacle course including negotiating over obstacles including hurdles/cones (with addition of toe tap to cones), followed by ambulation over complaint surfaces, and alternating marching. Increased challenge noted with marching  and SLS activities including toe tap to cone.      Exercises   Exercises Knee/Hip      Knee/Hip Exercises: Aerobic   Other Aerobic Completed SciFit on Level 2.5 x 8 minutes with BLE/BUE as warm up for improved motion/strength/activity tolerance.      Knee/Hip Exercises: Supine   Bridges Strengthening;Both;2 sets;10 reps;Limitations    Bridges Limitations completed first set without resistance  x 10 reps; progressed to addition of red theraband x 10 reps. Updated HEP to include red band.    Other Supine Knee/Hip Exercises completed clamshell 1 x 10 reps on bilateral side with addition of red theraband x 10 reps. progressed to adding to HEP            Access  Code: [email or web address] URL: [email or web address] Date: 12/28/2020 Prepared by: Baldomero Lamy  Exercises Sit to Stand - 2 x daily - 4 x weekly - 2 sets - 5 reps Romberg Stance - 2 x daily - 4 x weekly - 2 sets - 10 reps Alternating Step Taps with Counter Support - 2 x daily - 4 x weekly - 2 sets - 10 reps Supine Bridge with Resistance Band - 2 x daily - 4 x weekly - 1 sets - 10 reps Clamshell with Resistance - 2 x daily - 4 x weekly - 1 sets - 10 reps Romberg Stance with Eyes Closed - 1 x daily - 4 x weekly - 1 sets - 3 reps - 30 hold     Balance Exercises - 12/28/20 0001      Balance Exercises: Standing   Standing Eyes Opened Narrow base of support (BOS);Solid surface;Limitations    Standing Eyes Opened Limitations completed horizontal/vertical head turns as review of HEP    Standing Eyes Closed Solid surface;Narrow base of support (BOS);3 reps;30 secs;Limitations    Standing Eyes Closed Limitations updated HEP to include             PT Education - 12/28/20 0839    Education Details updated HEP    Person(s) Educated Patient    Methods Explanation;Demonstration;Handout    Comprehension Verbalized understanding;Returned demonstration            PT Short Term Goals - 12/12/20 1730      PT SHORT TERM GOAL #1   Title Patient will be independent with inital HEP for strengthening/balance (All STGs due: 12/12/20)    Baseline 12/12/20: met with current HEP    Status Achieved    Target Date 12/12/20      PT SHORT TERM GOAL #2   Title Patient will improve 5x sit <> stand to </= 18 seconds to demonstrate improved balance and functional strength    Baseline 12/12/20: 14.40 sec's no UE support from standard height surface    Status Achieved      PT SHORT TERM GOAL #3   Title Berg Balance to be assessed and LTG to be set as appropriate    Baseline 48/56 was initial score    Status Achieved             PT Long Term Goals - 12/13/20 1414      PT LONG TERM GOAL #1    Title Patient will be independent with final HEP for strengthening/balance (All LTGs Due:01/02/21)    Baseline no HEP established    Time 6    Period Weeks    Status On-going      PT LONG TERM GOAL #2   Title Pt will improve  BERG score to at least a 52/56 in order to demo decr fall risk.    Baseline 48/56    Time 6    Period Weeks    Status On-going      PT LONG TERM GOAL #3   Title Patient will improve TUG to </= 12 seconds to demosntrate reduced fall risk    Baseline 12/12/20: 13 sec's with cane    Time 6    Period Weeks    Status On-going      PT LONG TERM GOAL #4   Title Patient will improve 5x sit <> stand to </= 15 seconds to demonstrate reduced fall risk    Baseline 12/12/20: 14.40 sec's no UE support from standard height surface    Status Achieved      PT LONG TERM GOAL #5   Title Patient will improve gait speed to >/= 2.0 ft/sec to demonstrate improved community ambulation    Baseline 12/12/20: 2.21 ft/sec    Status Achieved      PT LONG TERM GOAL #6   Title Patient will demonstrate ability to ascend/descend 8 stairs with single rail and reciprocal pattern for improved household entry/exit    Baseline rail + SPC step to pattern, close CGA    Time 6    Period Weeks    Status On-going              Plan - 12/28/20 0804    Clinical Impression Statement Continued high level balance without AD working on continued negoitating over obstalces and SLS activites to further improve balance, intermittent rest breaks required due to fatigue. Updated/progressed to HEP. Patient continues to demo progress with PT services and will continue to benefit from skilled PT services.    Personal Factors and Comorbidities Comorbidity 3+;Time since onset of injury/illness/exacerbation    Comorbidities LBP, PAC, Panic Attacks, Depression, MS    Examination-Activity Limitations Bed Mobility;Transfers;Squat;Stairs;Stand;Locomotion Level    Examination-Participation Restrictions  Occupation;Cleaning;Community Activity    Stability/Clinical Decision Making Evolving/Moderate complexity    Rehab Potential Good    PT Frequency 2x / week    PT Duration 6 weeks    PT Treatment/Interventions ADLs/Self Care Home Management;Aquatic Therapy;Cryotherapy;Moist Heat;DME Instruction;Gait training;Stair training;Functional mobility training;Therapeutic activities;Therapeutic exercise;Balance training;Neuromuscular re-education;Patient/family education;Orthotic Fit/Training;Manual techniques;Passive range of motion;Vestibular;Electrical Stimulation    PT Next Visit Plan Continue SciFit/NuStep as warmup. Continue balance activites without UE support. Continue BLE standing strengthening. Goal Check at end of next week.    Consulted and Agree with Plan of Care Patient           Patient will benefit from skilled therapeutic intervention in order to improve the following deficits and impairments:  Abnormal gait,Decreased balance,Decreased endurance,Decreased mobility,Difficulty walking,Increased muscle spasms,Impaired sensation,Decreased range of motion,Decreased activity tolerance,Decreased knowledge of use of DME,Decreased strength,Impaired flexibility,Pain  Visit Diagnosis: Other abnormalities of gait and mobility  Difficulty in walking, not elsewhere classified  Unsteadiness on feet  Muscle weakness (generalized)     Problem List Patient Active Problem List   Diagnosis Date Noted  . Anxiety 12/16/2017  . Balance problem 02/27/2016  . Paresthesia 02/27/2016    Jones Bales, PT, DPT 12/28/2020, 8:45 AM  [facility] [address] [place], [place], [address] Phone: [phone]   Fax:  [phone]  Name: [name] MRN: [id number] Date of Birth: 1966-03-29

## 2021-01-02 ENCOUNTER — Encounter: Payer: Self-pay | Admitting: Physical Therapy

## 2021-01-02 ENCOUNTER — Ambulatory Visit: Payer: 59 | Admitting: Physical Therapy

## 2021-01-02 ENCOUNTER — Other Ambulatory Visit: Payer: Self-pay

## 2021-01-02 DIAGNOSIS — R2689 Other abnormalities of gait and mobility: Secondary | ICD-10-CM | POA: Diagnosis not present

## 2021-01-02 DIAGNOSIS — R262 Difficulty in walking, not elsewhere classified: Secondary | ICD-10-CM

## 2021-01-02 DIAGNOSIS — M6281 Muscle weakness (generalized): Secondary | ICD-10-CM

## 2021-01-02 DIAGNOSIS — R2681 Unsteadiness on feet: Secondary | ICD-10-CM

## 2021-01-03 NOTE — Therapy (Addendum)
[facility] [address] [place], [place], [address] Phone: [phone]   Fax:  [phone]  Physical Therapy Treatment/Re-Certification  Patient Details  Name: [name] MRN: [id number] Date of Birth: 23-Nov-1965 Referring Provider (PT): Metta Clines, DO   Encounter Date: 01/02/2021     01/02/21 0806  PT Visits / Re-Eval  Visit Number 11  Number of Visits 20  Date for PT Re-Evaluation 03/05/2021 (Updatd POC for 4 weeks, Cert for 60 days)  Authorization  Authorization Type UHC (VL: 60)  PT Time Calculation  PT Start Time 0804  PT Stop Time 0845  PT Time Calculation (min) 41 min  PT - End of Session  Equipment Utilized During Treatment Gait belt  Activity Tolerance Patient tolerated treatment well  Behavior During Therapy [facility] for tasks assessed/performed    Past Medical History:  Diagnosis Date  . Depression   . Heart murmur    as a child  . Hypercholesteremia   . Low back pain   . MS (multiple sclerosis) ([place])   . Neuromuscular disorder ([place])    MS dx 04/2020  . Numbness and tingling 2017   Feet  . PAC (premature atrial contraction) 1998  . Panic attacks     Past Surgical History:  Procedure Laterality Date  . ABDOMINAL HYSTERECTOMY  2007  . cesarian     1 time  . COLONOSCOPY  10/15/2020   [place]  . fibriod removal     uterine    There were no vitals filed for this visit.     01/02/21 0806  Symptoms/Limitations  Subjective No new complaints. No pain or falls.  Pertinent History LBP, PAC, Panic Attacks, Depression  Limitations Standing;Walking  How long can you walk comfortably? 1 hr.  Diagnostic tests MRI of the cervical spine without contrast on 02/25/2020 which demonstrated multiple hyperintense lesions throughout the cervical and upper thoracic cord.  Follow up MRI of brain with and without contrast on 03/31/2020 showed multiple T2 hyperintense lesions involving the  periventricular, subcortical and juxtacortical white matter with involvement of the callososeptal interface and brainstem, no enhancemen  Patient Stated Goals learn how to get herself stronger;  Pain Assessment  Currently in Pain? No/denies      01/02/21 0810  Transfers  Transfers Sit to Stand;Stand to Sit  Sit to Stand 5: Supervision;Without upper extremity assist;From bed;From chair/3-in-1  Five time sit to stand comments  12.53 sec's no UE support using standard height chair  Stand to Sit 5: Supervision;Without upper extremity assist;To bed;To chair/3-in-1  Ambulation/Gait  Ambulation/Gait Yes  Ambulation/Gait Assistance 5: Supervision  Ambulation Distance (Feet) 220 Feet (x1, plus around gym with session)  Assistive device Straight cane;None  Gait Pattern Step-through pattern;Decreased arm swing - right;Decreased step length - right  Ambulation Surface Indoor;Level  Gait velocity 13.16 sec's= 2.49 ft/sec with cane  Stairs Yes  Stairs Assistance 5: Supervision  Stairs Assistance Details (indicate cue type and reason) no cues or assistance needed  Stair Management Technique Two rails;Alternating pattern;Forwards  Number of Stairs 4 (x2)  Height of Stairs 6  Berg Balance Test  Sit to Stand 4  Standing Unsupported 4  Sitting with Back Unsupported but Feet Supported on Floor or Stool 4  Stand to Sit 4  Transfers 4  Standing Unsupported with Eyes Closed 4  Standing Unsupported with Feet Together 4  From Standing, Reach Forward with Outstretched Arm 4  From Standing Position, Pick up Object from Floor 4  From  Standing Position, Turn to Look Behind Over each Shoulder 4  Turn 360 Degrees 3 (3.75 sec's toward left, >4 sec's toward right)  Standing Unsupported, Alternately Place Feet on Step/Stool 3 (20.31 sec's)  Standing Unsupported, One Foot in Front 3  Standing on One Leg 1  Total Score 50  Timed Up and Go Test  TUG Normal TUG  Normal TUG (seconds) 12.22 (sec's with  cane; 12.66 sec's no AD)  Functional Gait  Assessment  Gait assessed  Yes  Gait Level Surface 1 (10.50 sec's)  Change in Gait Speed 2 (minor speed change with veering)  Gait with Horizontal Head Turns 2 (decr speed)  Gait with Vertical Head Turns 2 (decr speed)  Gait and Pivot Turn 2 (>3 sec's)  Step Over Obstacle 2  Gait with Narrow Base of Support 0  Gait with Eyes Closed 1 (16.28 sec's)  Ambulating Backwards 1 (25.75 sec's)  Steps 2  Total Score 15  FGA comment: <19 = high risk for falls        PT Short Term Goals - 12/12/20 1730      PT SHORT TERM GOAL #1   Title Patient will be independent with inital HEP for strengthening/balance (All STGs due: 12/12/20)    Baseline 12/12/20: met with current HEP    Status Achieved    Target Date 12/12/20      PT SHORT TERM GOAL #2   Title Patient will improve 5x sit <> stand to </= 18 seconds to demonstrate improved balance and functional strength    Baseline 12/12/20: 14.40 sec's no UE support from standard height surface    Status Achieved      PT SHORT TERM GOAL #3   Title Berg Balance to be assessed and LTG to be set as appropriate    Baseline 48/56 was initial score    Status Achieved             PT Long Term Goals - 01/02/21 6433      PT LONG TERM GOAL #1   Title Patient will be independent with final HEP for strengthening/balance (All LTGs Due:01/02/21)    Baseline 01/02/21: met with current HEP, will benefit from advancement as pt progresses as she reports current program is challenging.    Time --    Period --    Status Achieved      PT LONG TERM GOAL #2   Title Pt will improve BERG score to at least a 52/56 in order to demo decr fall risk.    Baseline 01/02/21: 50/56 scored, improved just not to goal level    Time --    Period --    Status Partially Met      PT LONG TERM GOAL #3   Title Patient will improve TUG to </= 12 seconds to demosntrate reduced fall risk    Baseline 01/02/21: 12.22 sec's, improved from  13 sec's due not fully to goal    Time --    Period --    Status Partially Met      PT LONG TERM GOAL #4   Title Patient will improve 5x sit <> stand to </= 15 seconds to demonstrate reduced fall risk    Baseline 01/02/21: 12.53 sec's no UE support from standard height chair    Status Achieved      PT LONG TERM GOAL #5   Title Patient will improve gait speed to >/= 2.0 ft/sec to demonstrate improved community ambulation    Baseline 01/02/21:  2.49 ft/sec, increased from last check    Status Achieved     PT LONG TERM GOAL #6   Title Patient will demonstrate ability to ascend/descend 8 stairs with single rail and reciprocal pattern for improved household entry/exit    Baseline 01/02/21: supervision with bil rails using reciprocal pattern supervision    Time --    Period --    Status Not Met          Updated Short Term Goals:   PT Short Term Goals - 01/04/21 1045      PT SHORT TERM GOAL #1   Title = LTGs          Updated Long Term Goals:     PT Long Term Goals - 01/04/21 1047      PT LONG TERM GOAL #1   Title Patient will be independent with final HEP for strengthening/balance (All LTGs Due: 02/01/21)    Baseline 01/02/21: met with current HEP, will benefit from advancement as pt progresses as she reports current program is challenging.    Time 4    Period Weeks    Status On-going    Target Date 02/01/21      PT LONG TERM GOAL #2   Title Pt will improve BERG score to at least a 52/56 in order to demo decr fall risk.    Baseline 01/02/21: 50/56 scored, improved just not to goal level    Time 4    Period Weeks    Status On-going      PT LONG TERM GOAL #3   Title Patient will improve TUG to </= 12 seconds to demosntrate reduced fall risk    Baseline 01/02/21: 12.22 sec's, improved from 13 sec's due not fully to goal    Time 4    Period Weeks    Status On-going      PT LONG TERM GOAL #4   Title Patient will be educated on fall prevention strategies to promote reduced fall risk  within the home    Baseline Dependent    Time 4    Period Weeks    Status New      PT LONG TERM GOAL #5   Title Patient will improve FGA to >/= 19/30 to demosntrate improved balance and reduced fall risk    Baseline 15/30    Time 4    Period Weeks    Status Revised      PT LONG TERM GOAL #6   Title Patient will demonstrate ability to ascend/descend 8 stairs with single rail and reciprocal pattern Mod I for improved household entry/exit    Baseline 01/02/21: supervision with bil rails using reciprocal pattern supervision    Time 4    Period Weeks    Status Revised               01/02/21 0806  Plan  Clinical Impression Statement Today's skilled session focused on progress toward LTGs for anticipated recert. Pt improved on most functional tests, just not to goal level. Pt's gait speed was slower this session that at last check. Also performed the FGA today for 1st time with baseline score of 15/30 with no device used. Pt is making progress toward goals. Primary PT plans to recert to continue to address balance with emphasis on complaint surfaces and gait with no device.  Personal Factors and Comorbidities Comorbidity 3+;Time since onset of injury/illness/exacerbation  Comorbidities LBP, PAC, Panic Attacks, Depression, MS  Examination-Activity Limitations Bed Mobility;Transfers;Squat;Stairs;Stand;Locomotion Level  Examination-Participation  Restrictions Occupation;Cleaning;Community Activity  Pt will benefit from skilled therapeutic intervention in order to improve on the following deficits Abnormal gait;Decreased balance;Decreased endurance;Decreased mobility;Difficulty walking;Increased muscle spasms;Impaired sensation;Decreased range of motion;Decreased activity tolerance;Decreased knowledge of use of DME;Decreased strength;Impaired flexibility;Pain  Stability/Clinical Decision Making Evolving/Moderate complexity  Rehab Potential Good  PT Frequency 2x / week  PT Duration 4 weeks   PT Treatment/Interventions ADLs/Self Care Home Management;Cryotherapy;Moist Heat;DME Instruction;Gait training;Stair training;Functional mobility training;Therapeutic activities;Therapeutic exercise;Balance training;Neuromuscular re-education;Patient/family education;Orthotic Fit/Training;Manual techniques;Passive range of motion;Vestibular;Electrical Stimulation  PT Next Visit Plan Continue SciFit/NuStep as warmup. Continue balance activites without UE support. Continue BLE standing strengthening. Gait with no device, progressing to dynamic gait with no device.  Consulted and Agree with Plan of Care Patient       Patient will benefit from skilled therapeutic intervention in order to improve the following deficits and impairments:  Abnormal gait,Decreased balance,Decreased endurance,Decreased mobility,Difficulty walking,Increased muscle spasms,Impaired sensation,Decreased range of motion,Decreased activity tolerance,Decreased knowledge of use of DME,Decreased strength,Impaired flexibility,Pain  Visit Diagnosis: Other abnormalities of gait and mobility  Difficulty in walking, not elsewhere classified  Unsteadiness on feet  Muscle weakness (generalized)     Problem List Patient Active Problem List   Diagnosis Date Noted  . Anxiety 12/16/2017  . Balance problem 02/27/2016  . Paresthesia 02/27/2016    Willow Ora, PTA, [facility] Neuro [facility] [address], [place], [place] [address] [phone] 01/03/21, 5:49 PM   Addendum Signed by: Charlotte Sanes, PT, DPT  Name: [name] MRN: [id number] Date of Birth: 12/22/1965

## 2021-01-04 ENCOUNTER — Encounter: Payer: Self-pay | Admitting: Physical Therapy

## 2021-01-04 ENCOUNTER — Ambulatory Visit: Payer: 59 | Admitting: Physical Therapy

## 2021-01-04 ENCOUNTER — Other Ambulatory Visit: Payer: Self-pay

## 2021-01-04 DIAGNOSIS — R2681 Unsteadiness on feet: Secondary | ICD-10-CM

## 2021-01-04 DIAGNOSIS — R262 Difficulty in walking, not elsewhere classified: Secondary | ICD-10-CM

## 2021-01-04 DIAGNOSIS — M6281 Muscle weakness (generalized): Secondary | ICD-10-CM

## 2021-01-04 DIAGNOSIS — R2689 Other abnormalities of gait and mobility: Secondary | ICD-10-CM | POA: Diagnosis not present

## 2021-01-04 NOTE — Addendum Note (Signed)
Addended by: Baldomero Lamy B on: 01/04/2021 10:54 AM   Modules accepted: Orders

## 2021-01-04 NOTE — Therapy (Addendum)
[facility] [address] [place], [place], [address] Phone: [phone]   Fax:  [phone]  Physical Therapy Treatment  Patient Details  Name: [name] MRN: [id number] Date of Birth: 02-Jun-1966 Referring Provider (PT): Metta Clines, DO   Encounter Date: 01/04/2021   PT End of Session - 01/04/21 0808    Visit Number 12    Number of Visits 20    Date for PT Re-Evaluation 03/05/21   POC for 6 weeks, Cert for 90 days   Authorization Type UHC (VL: 60)    PT Start Time 0805    PT Stop Time 0843    PT Time Calculation (min) 38 min    Equipment Utilized During Treatment Gait belt    Activity Tolerance Patient tolerated treatment well    Behavior During Therapy WFL for tasks assessed/performed           Past Medical History:  Diagnosis Date  . Depression   . Heart murmur    as a child  . Hypercholesteremia   . Low back pain   . MS (multiple sclerosis) ([place])   . Neuromuscular disorder ([place])    MS dx 04/2020  . Numbness and tingling 2017   Feet  . PAC (premature atrial contraction) 1998  . Panic attacks     Past Surgical History:  Procedure Laterality Date  . ABDOMINAL HYSTERECTOMY  2007  . cesarian     1 time  . COLONOSCOPY  10/15/2020   [place]  . fibriod removal     uterine    There were no vitals filed for this visit.   Subjective Assessment - 01/04/21 0807    Subjective No new complaints. No pain or falls. Has not been doing many ex's as she has been hurting all week from standing a lot at work the other night. Better today.    Pertinent History LBP, PAC, Panic Attacks, Depression    Limitations Standing;Walking    How long can you walk comfortably? 1 hr.    Diagnostic tests MRI of the cervical spine without contrast on 02/25/2020 which demonstrated multiple hyperintense lesions throughout the cervical and upper thoracic cord.  Follow up MRI of brain with and without contrast on  03/31/2020 showed multiple T2 hyperintense lesions involving the periventricular, subcortical and juxtacortical white matter with involvement of the callososeptal interface and brainstem, no enhancemen    Patient Stated Goals learn how to get herself stronger;    Currently in Pain? No/denies                [facility] Adult PT Treatment/Exercise - 01/04/21 0808      Transfers   Transfers Sit to Stand;Stand to Sit    Sit to Stand 5: Supervision;Without upper extremity assist;From bed;From chair/3-in-1    Stand to Sit 5: Supervision;Without upper extremity assist;To bed;To chair/3-in-1      Ambulation/Gait   Ambulation/Gait Yes    Ambulation/Gait Assistance 5: Supervision;4: Min guard    Ambulation/Gait Assistance Details use of  cane to enter/exit gym. no device used in gym. Min guard with dynamic gait for safety.    Assistive device Straight cane;None    Gait Pattern Step-through pattern;Decreased arm swing - right;Decreased step length - right    Ambulation Surface Level;Indoor      High Level Balance   High Level Balance Activities Side stepping;Marching forwards;Marching backwards;Tandem walking   tandem forward/backwards   High Level Balance Comments on red/blue mats next to counter: 3  laps each/each way with occasional light touch to counter to light UE support. min guard assist for safety with cues on posture, ex form and weight shifting.      Neuro Re-ed    Neuro Re-ed Details  for balance/muscle re-ed: gait around track with no AD working on scanning all directions, speed changes, sudden stops/start, direction change to retro gait/forward gait and 180* turns with min guard assist for balance; then gait around track while self tossing hankerchief, progressing to adding in cognitive task of naming animals a-z, decreased gait speed noted with veering at times, min guard assist for balance/safety.      Knee/Hip Exercises: Aerobic   Other Aerobic Completed SciFit on Level 3.0 x 8 minutes  with BLE/BUE as warm up for improved motion/strength/activity tolerance.                PT Short Term Goals - 01/04/21 1045      PT SHORT TERM GOAL #1   Title = LTGs             PT Long Term Goals - 01/04/21 1047      PT LONG TERM GOAL #1   Title Patient will be independent with final HEP for strengthening/balance (All LTGs Due: 02/01/21)    Baseline 01/02/21: met with current HEP, will benefit from advancement as pt progresses as she reports current program is challenging.    Time 4    Period Weeks    Status On-going    Target Date 02/01/21      PT LONG TERM GOAL #2   Title Pt will improve BERG score to at least a 52/56 in order to demo decr fall risk.    Baseline 01/02/21: 50/56 scored, improved just not to goal level    Time 4    Period Weeks    Status On-going      PT LONG TERM GOAL #3   Title Patient will improve TUG to </= 12 seconds to demosntrate reduced fall risk    Baseline 01/02/21: 12.22 sec's, improved from 13 sec's due not fully to goal    Time 4    Period Weeks    Status On-going      PT LONG TERM GOAL #4   Title Patient will be educated on fall prevention strategies to promote reduced fall risk within the home    Baseline Dependent    Time 4    Period Weeks    Status New      PT LONG TERM GOAL #5   Title Patient will improve FGA to >/= 19/30 to demosntrate improved balance and reduced fall risk    Baseline 15/30    Time 4    Period Weeks    Status Revised      PT LONG TERM GOAL #6   Title Patient will demonstrate ability to ascend/descend 8 stairs with single rail and reciprocal pattern Mod I for improved household entry/exit    Baseline 01/02/21: supervision with bil rails using reciprocal pattern supervision    Time 4    Period Weeks    Status Revised                 Plan - 01/04/21 0808    Clinical Impression Statement Today's skilled session continued to focus on strengthening, gait/balance with no AD/decreased UE support with min  guard to min assist needed for balance at times. No issues noted or reported in session. The pt is progressing toward goals and  should benefit from continued PT to progress toward unmet goals.    Personal Factors and Comorbidities Comorbidity 3+;Time since onset of injury/illness/exacerbation    Comorbidities LBP, PAC, Panic Attacks, Depression, MS    Examination-Activity Limitations Bed Mobility;Transfers;Squat;Stairs;Stand;Locomotion Level    Examination-Participation Restrictions Occupation;Cleaning;Community Activity    Stability/Clinical Decision Making Evolving/Moderate complexity    Rehab Potential Good    PT Frequency 2x / week    PT Duration 6 weeks    PT Treatment/Interventions ADLs/Self Care Home Management;Cryotherapy;Moist Heat;DME Instruction;Gait training;Stair training;Functional mobility training;Therapeutic activities;Therapeutic exercise;Balance training;Neuromuscular re-education;Patient/family education;Orthotic Fit/Training;Manual techniques;Passive range of motion;Vestibular;Electrical Stimulation    PT Next Visit Plan Continue SciFit/NuStep as warmup. Continue balance activites without UE support. Continue BLE standing strengthening. Gait with no device, progressing to dynamic gait with no device.    Consulted and Agree with Plan of Care Patient           Patient will benefit from skilled therapeutic intervention in order to improve the following deficits and impairments:  Abnormal gait,Decreased balance,Decreased endurance,Decreased mobility,Difficulty walking,Increased muscle spasms,Impaired sensation,Decreased range of motion,Decreased activity tolerance,Decreased knowledge of use of DME,Decreased strength,Impaired flexibility,Pain  Visit Diagnosis: Other abnormalities of gait and mobility  Difficulty in walking, not elsewhere classified  Unsteadiness on feet  Muscle weakness (generalized)     Problem List Patient Active Problem List   Diagnosis Date Noted   . Anxiety 12/16/2017  . Balance problem 02/27/2016  . Paresthesia 02/27/2016    Willow Ora, PTA, [facility] Outpatient Neuro [facility] [address] [place], [place] [address] [phone] 01/04/21, 3:55 PM   Name: [name] MRN: [id number] Date of Birth: 12-07-1965

## 2021-01-08 ENCOUNTER — Ambulatory Visit: Payer: 59 | Admitting: Physical Therapy

## 2021-01-09 ENCOUNTER — Ambulatory Visit: Payer: 59 | Admitting: Physical Therapy

## 2021-01-11 ENCOUNTER — Ambulatory Visit: Payer: 59 | Admitting: Physical Therapy

## 2021-01-11 ENCOUNTER — Encounter: Payer: Self-pay | Admitting: Physical Therapy

## 2021-01-11 ENCOUNTER — Other Ambulatory Visit: Payer: Self-pay

## 2021-01-11 DIAGNOSIS — R2681 Unsteadiness on feet: Secondary | ICD-10-CM

## 2021-01-11 DIAGNOSIS — R2689 Other abnormalities of gait and mobility: Secondary | ICD-10-CM

## 2021-01-11 DIAGNOSIS — R262 Difficulty in walking, not elsewhere classified: Secondary | ICD-10-CM

## 2021-01-11 NOTE — Therapy (Signed)
[facility] [address] [place], [place], [address] Phone: [phone]   Fax:  [phone]  Physical Therapy Treatment  Patient Details  Name: [name] MRN: [id number] Date of Birth: Nov 23, 1965 Referring Provider (PT): Metta Clines, DO   Encounter Date: 01/11/2021   PT End of Session - 01/11/21 1154    Visit Number 13    Number of Visits 20    Date for PT Re-Evaluation 03/05/21   POC for 6 weeks, Cert for 90 days   Authorization Type UHC (VL: 60)    PT Start Time 0801    PT Stop Time 0842    PT Time Calculation (min) 41 min    Equipment Utilized During Treatment Gait belt    Activity Tolerance Patient tolerated treatment well    Behavior During Therapy WFL for tasks assessed/performed           Past Medical History:  Diagnosis Date  . Depression   . Heart murmur    as a child  . Hypercholesteremia   . Low back pain   . MS (multiple sclerosis) ([place])   . Neuromuscular disorder ([place])    MS dx 04/2020  . Numbness and tingling 2017   Feet  . PAC (premature atrial contraction) 1998  . Panic attacks     Past Surgical History:  Procedure Laterality Date  . ABDOMINAL HYSTERECTOMY  2007  . cesarian     1 time  . COLONOSCOPY  10/15/2020   [place]  . fibriod removal     uterine    There were no vitals filed for this visit.   Subjective Assessment - 01/11/21 0803    Subjective No changes, no falls.    Pertinent History LBP, PAC, Panic Attacks, Depression    Limitations Standing;Walking    How long can you walk comfortably? 1 hr.    Diagnostic tests MRI of the cervical spine without contrast on 02/25/2020 which demonstrated multiple hyperintense lesions throughout the cervical and upper thoracic cord.  Follow up MRI of brain with and without contrast on 03/31/2020 showed multiple T2 hyperintense lesions involving the periventricular, subcortical and juxtacortical white matter with involvement of  the callososeptal interface and brainstem, no enhancemen    Patient Stated Goals learn how to get herself stronger;    Currently in Pain? No/denies                             [facility] Adult PT Treatment/Exercise - 01/11/21 0805      Ambulation/Gait   Ambulation/Gait Yes    Ambulation/Gait Assistance 5: Supervision;4: Min guard    Ambulation/Gait Assistance Details use of cane to come into session, x3 laps no AD - working on speeding up gait, start/stops, retro gait, and gait with scanning environment with turns/nods, min guard for safety. Plus additional distances with no AD throughout session.     Ambulation Distance (Feet) 230 Feet    Assistive device Straight cane;None    Gait Pattern Step-through pattern;Decreased arm swing - right;Decreased step length - right    Ambulation Surface Level;Indoor      Knee/Hip Exercises: Aerobic   Other Aerobic Completed SciFit on Level 3.0 x 8 minutes with BLE/BUE for improved motion/strength/activity tolerance.               Balance Exercises - 01/11/21 0001      Balance Exercises: Standing   Standing Eyes Opened Narrow base of  support (BOS);Limitations;Foam/compliant surface    Standing Eyes Opened Limitations on blue air ex: 2 x 5 reps head turns, 2 x 5 reps head nods    Standing Eyes Closed 3 reps;30 secs;Limitations;Foam/compliant surface    Standing Eyes Closed Limitations feet hip width 3 x 30 seconds    SLS with Vectors Foam/compliant surface;Intermittent upper extremity assist;Limitations    SLS with Vectors Limitations alternating toe taps to to bottom 6" step on blue air ex x10 reps B with no UE support - min guard for balance, then modified SLS with each leg on step, x3 reps head turns each leg, repeated with head nods x3 reps, min guard for balance   Step Over Hurdles / Cones stepping over 4 smaller orange obstacles next to countertop: down and back 4 reps, beginning with UE support and progressing to none    Sit  to Stand Without upper extremity support;Foam/compliant surface;Limitations    Sit to Stand Limitations on red balance beam: x8 reps with min guard for balance               PT Short Term Goals - 01/04/21 1045      PT SHORT TERM GOAL #1   Title = LTGs             PT Long Term Goals - 01/04/21 1047      PT LONG TERM GOAL #1   Title Patient will be independent with final HEP for strengthening/balance (All LTGs Due: 02/01/21)    Baseline 01/02/21: met with current HEP, will benefit from advancement as pt progresses as she reports current program is challenging.    Time 4    Period Weeks    Status On-going    Target Date 02/01/21      PT LONG TERM GOAL #2   Title Pt will improve BERG score to at least a 52/56 in order to demo decr fall risk.    Baseline 01/02/21: 50/56 scored, improved just not to goal level    Time 4    Period Weeks    Status On-going      PT LONG TERM GOAL #3   Title Patient will improve TUG to </= 12 seconds to demosntrate reduced fall risk    Baseline 01/02/21: 12.22 sec's, improved from 13 sec's due not fully to goal    Time 4    Period Weeks    Status On-going      PT LONG TERM GOAL #4   Title Patient will be educated on fall prevention strategies to promote reduced fall risk within the home    Baseline Dependent    Time 4    Period Weeks    Status New      PT LONG TERM GOAL #5   Title Patient will improve FGA to >/= 19/30 to demosntrate improved balance and reduced fall risk    Baseline 15/30    Time 4    Period Weeks    Status Revised      PT LONG TERM GOAL #6   Title Patient will demonstrate ability to ascend/descend 8 stairs with single rail and reciprocal pattern Mod I for improved household entry/exit    Baseline 01/02/21: supervision with bil rails using reciprocal pattern supervision    Time 4    Period Weeks    Status Revised                 Plan - 01/11/21 1155    Clinical Impression Statement Today's  skilled session  focused on dynamic gait tasks with no AD, strengthening, and balance on compliant surfaces. Pt tolerated session well, intermittent rest breaks taken as appropriate. Pt challenged by gait with head motions, needing min guard. Will continue to progress towards LTGs.    Personal Factors and Comorbidities Comorbidity 3+;Time since onset of injury/illness/exacerbation    Comorbidities LBP, PAC, Panic Attacks, Depression, MS    Examination-Activity Limitations Bed Mobility;Transfers;Squat;Stairs;Stand;Locomotion Level    Examination-Participation Restrictions Occupation;Cleaning;Community Activity    Stability/Clinical Decision Making Evolving/Moderate complexity    Rehab Potential Good    PT Frequency 2x / week    PT Duration 6 weeks    PT Treatment/Interventions ADLs/Self Care Home Management;Cryotherapy;Moist Heat;DME Instruction;Gait training;Stair training;Functional mobility training;Therapeutic activities;Therapeutic exercise;Balance training;Neuromuscular re-education;Patient/family education;Orthotic Fit/Training;Manual techniques;Passive range of motion;Vestibular;Electrical Stimulation    PT Next Visit Plan Continue SciFit/NuStep as warmup. Continue balance activites without UE support. Continue BLE standing strengthening. Gait with no device, progressing to dynamic gait with no device.    Consulted and Agree with Plan of Care Patient           Patient will benefit from skilled therapeutic intervention in order to improve the following deficits and impairments:  Abnormal gait,Decreased balance,Decreased endurance,Decreased mobility,Difficulty walking,Increased muscle spasms,Impaired sensation,Decreased range of motion,Decreased activity tolerance,Decreased knowledge of use of DME,Decreased strength,Impaired flexibility,Pain  Visit Diagnosis: Other abnormalities of gait and mobility  Unsteadiness on feet  Difficulty in walking, not elsewhere classified     Problem List Patient  Active Problem List   Diagnosis Date Noted  . Anxiety 12/16/2017  . Balance problem 02/27/2016  . Paresthesia 02/27/2016    Arliss Journey, PT, DPT 01/11/2021, 11:59 AM  [facility] [address] [place], [place], [address] Phone: [phone]   Fax:  [phone]  Name: CARAL WHAN MRN: [id number] Date of Birth: [birth year]

## 2021-01-15 ENCOUNTER — Other Ambulatory Visit: Payer: Self-pay

## 2021-01-15 ENCOUNTER — Ambulatory Visit: Payer: 59 | Admitting: Physical Therapy

## 2021-01-15 ENCOUNTER — Encounter: Payer: Self-pay | Admitting: Physical Therapy

## 2021-01-15 DIAGNOSIS — M6281 Muscle weakness (generalized): Secondary | ICD-10-CM

## 2021-01-15 DIAGNOSIS — R2681 Unsteadiness on feet: Secondary | ICD-10-CM

## 2021-01-15 DIAGNOSIS — R262 Difficulty in walking, not elsewhere classified: Secondary | ICD-10-CM

## 2021-01-15 DIAGNOSIS — R2689 Other abnormalities of gait and mobility: Secondary | ICD-10-CM | POA: Diagnosis not present

## 2021-01-15 NOTE — Therapy (Signed)
[facility] [address] [place], [place], [address] Phone: [phone]   Fax:  [phone]  Physical Therapy Treatment  Patient Details  Name: [name] MRN: [id number] Date of Birth: 15-Mar-1966 Referring Provider (PT): Metta Clines, DO   Encounter Date: 01/15/2021   PT End of Session - 01/15/21 0803    Visit Number 14    Number of Visits 20    Date for PT Re-Evaluation 03/05/21   POC for 6 weeks, Cert for 90 days   Authorization Type UHC (VL: 60)    PT Start Time 0801    PT Stop Time 0841    PT Time Calculation (min) 40 min    Equipment Utilized During Treatment Gait belt    Activity Tolerance Patient tolerated treatment well    Behavior During Therapy [facility] for tasks assessed/performed           Past Medical History:  Diagnosis Date  . Depression   . Heart murmur    as a child  . Hypercholesteremia   . Low back pain   . MS (multiple sclerosis) ([place])   . Neuromuscular disorder ([place])    MS dx 04/2020  . Numbness and tingling 2017   Feet  . PAC (premature atrial contraction) 1998  . Panic attacks     Past Surgical History:  Procedure Laterality Date  . ABDOMINAL HYSTERECTOMY  2007  . cesarian     1 time  . COLONOSCOPY  10/15/2020   [place]  . fibriod removal     uterine    There were no vitals filed for this visit.   Subjective Assessment - 01/15/21 0803    Subjective No new complaints.  No falls or pain to report.    Pertinent History LBP, PAC, Panic Attacks, Depression    Limitations Standing;Walking    How long can you walk comfortably? 1 hr.    Diagnostic tests MRI of the cervical spine without contrast on 02/25/2020 which demonstrated multiple hyperintense lesions throughout the cervical and upper thoracic cord.  Follow up MRI of brain with and without contrast on 03/31/2020 showed multiple T2 hyperintense lesions involving the periventricular, subcortical and juxtacortical white  matter with involvement of the callososeptal interface and brainstem, no enhancemen    Patient Stated Goals learn how to get herself stronger;    Currently in Pain? No/denies                 [facility] Adult PT Treatment/Exercise - 01/15/21 0805      Transfers   Transfers Sit to Stand;Stand to Sit    Sit to Stand 5: Supervision;Without upper extremity assist;From bed;From chair/3-in-1    Stand to Sit 5: Supervision;Without upper extremity assist;To bed;To chair/3-in-1      Ambulation/Gait   Ambulation/Gait Yes    Ambulation/Gait Assistance 5: Supervision;4: Min guard    Ambulation Distance (Feet) 230 Feet   x1, plus around gym   Assistive device Straight cane;None    Gait Pattern Step-through pattern;Decreased arm swing - right;Decreased step length - right    Ambulation Surface Level;Indoor      High Level Balance   High Level Balance Activities Marching forwards;Marching backwards;Tandem walking   tandem/heel/toe walking fwd/bwd   High Level Balance Comments on red/blue mats next to counter: 3 laps each/each way with occasional light touch to counter to light UE support. min guard assist for safety with cues on posture, ex form and weight shifting.  Neuro Re-ed    Neuro Re-ed Details  for balance/muscle re-ed: gait around track with no AD working on scanning all directions, speed changes, sudden stops/starts with min guard assist for balance;      Knee/Hip Exercises: Aerobic   Other Aerobic Completed SciFit on Level 3.0 x 8 minutes with BLE/BUE for improved motion/strength/activity tolerance.               Balance Exercises - 01/15/21 0833      Balance Exercises: Standing   Step Over Hurdles / Cones 4 small hurdles on red mat next to counter top: forward reciprocal stepping for 6 laps, then lateral stepping for 2 laps each way. cues for increased hip/knee flexion to clear hurdles. min guard assist with light touch to counter for balance.               PT Short  Term Goals - 01/04/21 1045      PT SHORT TERM GOAL #1   Title = LTGs             PT Long Term Goals - 01/04/21 1047      PT LONG TERM GOAL #1   Title Patient will be independent with final HEP for strengthening/balance (All LTGs Due: 02/01/21)    Baseline 01/02/21: met with current HEP, will benefit from advancement as pt progresses as she reports current program is challenging.    Time 4    Period Weeks    Status On-going    Target Date 02/01/21      PT LONG TERM GOAL #2   Title Pt will improve BERG score to at least a 52/56 in order to demo decr fall risk.    Baseline 01/02/21: 50/56 scored, improved just not to goal level    Time 4    Period Weeks    Status On-going      PT LONG TERM GOAL #3   Title Patient will improve TUG to </= 12 seconds to demosntrate reduced fall risk    Baseline 01/02/21: 12.22 sec's, improved from 13 sec's due not fully to goal    Time 4    Period Weeks    Status On-going      PT LONG TERM GOAL #4   Title Patient will be educated on fall prevention strategies to promote reduced fall risk within the home    Baseline Dependent    Time 4    Period Weeks    Status New      PT LONG TERM GOAL #5   Title Patient will improve FGA to >/= 19/30 to demosntrate improved balance and reduced fall risk    Baseline 15/30    Time 4    Period Weeks    Status Revised      PT LONG TERM GOAL #6   Title Patient will demonstrate ability to ascend/descend 8 stairs with single rail and reciprocal pattern Mod I for improved household entry/exit    Baseline 01/02/21: supervision with bil rails using reciprocal pattern supervision    Time 4    Period Weeks    Status Revised                 Plan - 01/15/21 0804    Clinical Impression Statement Today's skilled session continued to focus on strengthening and gait/balance with no AD. Rest breaks taken as needed. Up to min guard assist needed for balance on compliant surfaces. The pt is progressing toward goals and  should benefit from  continued PT to progress toward unmet goals.    Personal Factors and Comorbidities Comorbidity 3+;Time since onset of injury/illness/exacerbation    Comorbidities LBP, PAC, Panic Attacks, Depression, MS    Examination-Activity Limitations Bed Mobility;Transfers;Squat;Stairs;Stand;Locomotion Level    Examination-Participation Restrictions Occupation;Cleaning;Community Activity    Stability/Clinical Decision Making Evolving/Moderate complexity    Rehab Potential Good    PT Frequency 2x / week    PT Duration 6 weeks    PT Treatment/Interventions ADLs/Self Care Home Management;Cryotherapy;Moist Heat;DME Instruction;Gait training;Stair training;Functional mobility training;Therapeutic activities;Therapeutic exercise;Balance training;Neuromuscular re-education;Patient/family education;Orthotic Fit/Training;Manual techniques;Passive range of motion;Vestibular;Electrical Stimulation    PT Next Visit Plan Continue SciFit/NuStep as warmup. Continue balance activites without UE support. Continue BLE standing strengthening. Gait with no device, progressing to dynamic gait with no device.    Consulted and Agree with Plan of Care Patient           Patient will benefit from skilled therapeutic intervention in order to improve the following deficits and impairments:  Abnormal gait,Decreased balance,Decreased endurance,Decreased mobility,Difficulty walking,Increased muscle spasms,Impaired sensation,Decreased range of motion,Decreased activity tolerance,Decreased knowledge of use of DME,Decreased strength,Impaired flexibility,Pain  Visit Diagnosis: Other abnormalities of gait and mobility  Unsteadiness on feet  Difficulty in walking, not elsewhere classified  Muscle weakness (generalized)     Problem List Patient Active Problem List   Diagnosis Date Noted  . Anxiety 12/16/2017  . Balance problem 02/27/2016  . Paresthesia 02/27/2016    Willow Ora, PTA, [facility]  [facility] [address] [place], [place] [address] [phone] 01/15/21, 10:46 AM   Name: [name] MRN: [id number] Date of Birth: 22-May-1966

## 2021-01-18 ENCOUNTER — Encounter: Payer: Self-pay | Admitting: Physical Therapy

## 2021-01-18 ENCOUNTER — Other Ambulatory Visit: Payer: Self-pay

## 2021-01-18 ENCOUNTER — Ambulatory Visit: Payer: 59 | Admitting: Physical Therapy

## 2021-01-18 DIAGNOSIS — R2689 Other abnormalities of gait and mobility: Secondary | ICD-10-CM

## 2021-01-18 DIAGNOSIS — R2681 Unsteadiness on feet: Secondary | ICD-10-CM

## 2021-01-18 DIAGNOSIS — M6281 Muscle weakness (generalized): Secondary | ICD-10-CM

## 2021-01-18 DIAGNOSIS — R262 Difficulty in walking, not elsewhere classified: Secondary | ICD-10-CM

## 2021-01-18 NOTE — Therapy (Signed)
[facility] [address] [place], [place], [address] Phone: [phone]   Fax:  [phone]  Physical Therapy Treatment  Patient Details  Name: [name] MRN: [id number] Date of Birth: 05/25/1966 Referring Provider (PT): Metta Clines, DO   Encounter Date: 01/18/2021   PT End of Session - 01/18/21 0806    Visit Number 15    Number of Visits 20    Date for PT Re-Evaluation 03/05/21   POC for 6 weeks, Cert for 90 days   Authorization Type UHC (VL: 60)    PT Start Time 0804    PT Stop Time 0845    PT Time Calculation (min) 41 min    Equipment Utilized During Treatment Gait belt    Activity Tolerance Patient tolerated treatment well    Behavior During Therapy WFL for tasks assessed/performed           Past Medical History:  Diagnosis Date  . Depression   . Heart murmur    as a child  . Hypercholesteremia   . Low back pain   . MS (multiple sclerosis) ([place])   . Neuromuscular disorder ([place])    MS dx 04/2020  . Numbness and tingling 2017   Feet  . PAC (premature atrial contraction) 1998  . Panic attacks     Past Surgical History:  Procedure Laterality Date  . ABDOMINAL HYSTERECTOMY  2007  . cesarian     1 time  . COLONOSCOPY  10/15/2020   [place]  . fibriod removal     uterine    There were no vitals filed for this visit.   Subjective Assessment - 01/18/21 0806    Subjective No new complaints.  No falls or pain to report.    Pertinent History LBP, PAC, Panic Attacks, Depression    Limitations Standing;Walking    How long can you walk comfortably? 1 hr.    Diagnostic tests MRI of the cervical spine without contrast on 02/25/2020 which demonstrated multiple hyperintense lesions throughout the cervical and upper thoracic cord.  Follow up MRI of brain with and without contrast on 03/31/2020 showed multiple T2 hyperintense lesions involving the periventricular, subcortical and juxtacortical white  matter with involvement of the callososeptal interface and brainstem, no enhancemen    Patient Stated Goals learn how to get herself stronger;    Currently in Pain? No/denies                [facility] Adult PT Treatment/Exercise - 01/18/21 0806      Transfers   Transfers Sit to Stand;Stand to Sit    Sit to Stand 5: Supervision;Without upper extremity assist;From bed;From chair/3-in-1    Stand to Sit 5: Supervision;Without upper extremity assist;To bed;To chair/3-in-1      Ambulation/Gait   Ambulation/Gait Yes    Ambulation/Gait Assistance 5: Supervision;4: Min guard    Ambulation/Gait Assistance Details use of no AD in session. Pt has cane she uses to enter/exit session.    Assistive device Straight cane;None    Gait Pattern Step-through pattern;Decreased arm swing - right;Decreased step length - right    Ambulation Surface Level;Indoor      Neuro Re-ed    Neuro Re-ed Details  for balance/NMR: gait around track working on scanning all directions randomly, speed changes, sudden stops/starts and direction changes from forward<>backwards with min guard assist. occasional veering noted with head turns; gait around track while self tossing ball while naming animals a-z with decreased gait speed noted, min guard  assist.      Knee/Hip Exercises: Aerobic   Other Aerobic Completed SciFit on Level 3.0 x 8 minutes with BLE/BUE for improved motion/strength/activity tolerance.               Balance Exercises - 01/18/21 0824      Balance Exercises: Standing   Standing Eyes Closed Wide (BOA);Head turns;Foam/compliant surface;Other reps (comment);30 secs;Limitations    Standing Eyes Closed Limitations standing across blue foam beam with feet hip width apart: EC for 30 sec's x 3 reps, progressing to EC head movements left<>right, up<>down for ~10 reps each with min guard to min assist for balance. cues on posture and weight shifting to assist with balance.    SLS with Vectors Foam/compliant  surface;Intermittent upper extremity assist;Limitations    SLS with Vectors Limitations standing across blue foam beam with 2 tall cones in front- alternating forward, cross, double forward foot taps to the cones for ~10 reps each with cues on lateral weight shifting and posture. Min guard assist with no UE support, occasional touch to bars for balance.    Tandem Gait Forward;Intermittent upper extremity support;Retro;4 reps;Limitations    Tandem Gait Limitations on blue foam beam with no UE support, occasional touch to bars for balance as needed with cues for step placement on beam and posture.    Sidestepping Foam/compliant support;4 reps;Limitations    Sidestepping Limitations on blue foam beam with no UE support, occasional touch to bars for balance as needed. Min guard assist for balance.               PT Short Term Goals - 01/04/21 1045      PT SHORT TERM GOAL #1   Title = LTGs             PT Long Term Goals - 01/04/21 1047      PT LONG TERM GOAL #1   Title Patient will be independent with final HEP for strengthening/balance (All LTGs Due: 02/01/21)    Baseline 01/02/21: met with current HEP, will benefit from advancement as pt progresses as she reports current program is challenging.    Time 4    Period Weeks    Status On-going    Target Date 02/01/21      PT LONG TERM GOAL #2   Title Pt will improve BERG score to at least a 52/56 in order to demo decr fall risk.    Baseline 01/02/21: 50/56 scored, improved just not to goal level    Time 4    Period Weeks    Status On-going      PT LONG TERM GOAL #3   Title Patient will improve TUG to </= 12 seconds to demosntrate reduced fall risk    Baseline 01/02/21: 12.22 sec's, improved from 13 sec's due not fully to goal    Time 4    Period Weeks    Status On-going      PT LONG TERM GOAL #4   Title Patient will be educated on fall prevention strategies to promote reduced fall risk within the home    Baseline Dependent    Time 4     Period Weeks    Status New      PT LONG TERM GOAL #5   Title Patient will improve FGA to >/= 19/30 to demosntrate improved balance and reduced fall risk    Baseline 15/30    Time 4    Period Weeks    Status Revised  PT LONG TERM GOAL #6   Title Patient will demonstrate ability to ascend/descend 8 stairs with single rail and reciprocal pattern Mod I for improved household entry/exit    Baseline 01/02/21: supervision with bil rails using reciprocal pattern supervision    Time 4    Period Weeks    Status Revised                 Plan - 01/18/21 0806    Clinical Impression Statement Today's skilled session continued to focus on strengthening and balance on compliant surfaces with decreased UE support. Continue to use no AD for gait in session with no issues noted or reported in session. The pt is progressing toward goals and should benefit from continued PT to progress toward unmet goals.    Personal Factors and Comorbidities Comorbidity 3+;Time since onset of injury/illness/exacerbation    Comorbidities LBP, PAC, Panic Attacks, Depression, MS    Examination-Activity Limitations Bed Mobility;Transfers;Squat;Stairs;Stand;Locomotion Level    Examination-Participation Restrictions Occupation;Cleaning;Community Activity    Stability/Clinical Decision Making Evolving/Moderate complexity    Rehab Potential Good    PT Frequency 2x / week    PT Duration 6 weeks    PT Treatment/Interventions ADLs/Self Care Home Management;Cryotherapy;Moist Heat;DME Instruction;Gait training;Stair training;Functional mobility training;Therapeutic activities;Therapeutic exercise;Balance training;Neuromuscular re-education;Patient/family education;Orthotic Fit/Training;Manual techniques;Passive range of motion;Vestibular;Electrical Stimulation    PT Next Visit Plan Continue SciFit/NuStep as warmup. Continue balance activites without UE support. Continue BLE standing strengthening. Gait with no device,  progressing to dynamic gait with no device.    Consulted and Agree with Plan of Care Patient           Patient will benefit from skilled therapeutic intervention in order to improve the following deficits and impairments:  Abnormal gait,Decreased balance,Decreased endurance,Decreased mobility,Difficulty walking,Increased muscle spasms,Impaired sensation,Decreased range of motion,Decreased activity tolerance,Decreased knowledge of use of DME,Decreased strength,Impaired flexibility,Pain  Visit Diagnosis: Other abnormalities of gait and mobility  Unsteadiness on feet  Difficulty in walking, not elsewhere classified  Muscle weakness (generalized)     Problem List Patient Active Problem List   Diagnosis Date Noted  . Anxiety 12/16/2017  . Balance problem 02/27/2016  . Paresthesia 02/27/2016    [name], PTA, [facility] [address] [place], [place] [address] [phone] 01/18/21, 4:50 PM   Name: [name] MRN: [id number] Date of Birth: 1965/12/28

## 2021-01-23 ENCOUNTER — Ambulatory Visit: Payer: 59

## 2021-01-23 ENCOUNTER — Other Ambulatory Visit: Payer: Self-pay

## 2021-01-23 DIAGNOSIS — R2689 Other abnormalities of gait and mobility: Secondary | ICD-10-CM | POA: Diagnosis not present

## 2021-01-23 DIAGNOSIS — R262 Difficulty in walking, not elsewhere classified: Secondary | ICD-10-CM

## 2021-01-23 DIAGNOSIS — R2681 Unsteadiness on feet: Secondary | ICD-10-CM

## 2021-01-23 DIAGNOSIS — M6281 Muscle weakness (generalized): Secondary | ICD-10-CM

## 2021-01-23 NOTE — Therapy (Signed)
[facility] [address] [place], [place], [address] Phone: [phone]   Fax:  [phone]  Physical Therapy Treatment  Patient Details  Name: [name] MRN: [id number] Date of Birth: 10-16-1966 Referring Provider (PT): Metta Clines, DO   Encounter Date: 01/23/2021   PT End of Session - 01/23/21 0854    Visit Number 16    Number of Visits 20    Date for PT Re-Evaluation 03/05/21   POC for 6 weeks, Cert for 90 days   Authorization Type UHC (VL: 60)    PT Start Time 0800    PT Stop Time 0843    PT Time Calculation (min) 43 min    Equipment Utilized During Treatment Gait belt    Activity Tolerance Patient tolerated treatment well    Behavior During Therapy [facility] for tasks assessed/performed           Past Medical History:  Diagnosis Date  . Depression   . Heart murmur    as a child  . Hypercholesteremia   . Low back pain   . MS (multiple sclerosis) ([place])   . Neuromuscular disorder ([place])    MS dx 04/2020  . Numbness and tingling 2017   Feet  . PAC (premature atrial contraction) 1998  . Panic attacks     Past Surgical History:  Procedure Laterality Date  . ABDOMINAL HYSTERECTOMY  2007  . cesarian     1 time  . COLONOSCOPY  10/15/2020   [place]  . fibriod removal     uterine    There were no vitals filed for this visit.   Subjective Assessment - 01/23/21 0803    Subjective Pt reports feeling good this morning. Nothing new to report.    Pertinent History LBP, PAC, Panic Attacks, Depression    Limitations Standing;Walking    How long can you walk comfortably? 1 hr.    Diagnostic tests MRI of the cervical spine without contrast on 02/25/2020 which demonstrated multiple hyperintense lesions throughout the cervical and upper thoracic cord.  Follow up MRI of brain with and without contrast on 03/31/2020 showed multiple T2 hyperintense lesions involving the periventricular, subcortical and  juxtacortical white matter with involvement of the callososeptal interface and brainstem, no enhancemen    Patient Stated Goals learn how to get herself stronger;    Currently in Pain? No/denies                             [facility] Adult PT Treatment/Exercise - 01/23/21 0842      Ambulation/Gait   Ambulation/Gait Yes    Ambulation/Gait Assistance 5: Supervision    Ambulation/Gait Assistance Details Slight instability that pt was able to self correct    Ambulation Distance (Feet) 230 Feet    Assistive device None    Gait Pattern Step-through pattern;Decreased stance time - left;Decreased step length - right    Ambulation Surface Level;Indoor      Knee/Hip Exercises: Aerobic   Other Aerobic SciFit Level 3.0 for 8 minutes; BUE/BLE work, for strengthening and improving activity tolerance.               Balance Exercises - 01/23/21 0845      Balance Exercises: Standing   SLS with Vectors Solid surface;Limitations   x10 reps each; stepping onto squishy dots placed 45, 60 and 95 from parallel   SLS with Vectors Limitations Verbal cueing to push into dot to  return to standing position; struggled the most with 95 from parallel on both sides, pt tended to shift whole body to face dot versus reaching with leg, verbal cueing addressed issue    Rockerboard Anterior/posterior;Lateral;30 seconds;Limitations   weightshifting ant/posterior and laterally x30sec each; static balance x30sec; x60 sec ball throwing each, x60sec single arm softball throw   Turning Right;Left;Limitations   with walking; turns varied on therapist command   Turning Limitations No significant LOB; pt was slow with turning    Marching Solid surface;Dynamic;Limitations   2x92f   Marching Limitations Pt cued to remain on stance leg for 1-2 seconds and maintain a slower gait pattern as pt tended to try to go faster and lose balance and form    Other Standing Exercises Walking backwards [id number]   Other Standing  Exercises Comments Short step length but no significant LOB; verbal cueing to push lead foot from ground similar to dot exercise and increase step length             PT Education - 01/23/21 0854    Education Details Gait patterning, pattern for walking backwards    Person(s) Educated Patient    Methods Explanation    Comprehension Verbalized understanding            PT Short Term Goals - 01/04/21 1045      PT SHORT TERM GOAL #1   Title = LTGs             PT Long Term Goals - 01/04/21 1047      PT LONG TERM GOAL #1   Title Patient will be independent with final HEP for strengthening/balance (All LTGs Due: 02/01/21)    Baseline 01/02/21: met with current HEP, will benefit from advancement as pt progresses as she reports current program is challenging.    Time 4    Period Weeks    Status On-going    Target Date 02/01/21      PT LONG TERM GOAL #2   Title Pt will improve BERG score to at least a 52/56 in order to demo decr fall risk.    Baseline 01/02/21: 50/56 scored, improved just not to goal level    Time 4    Period Weeks    Status On-going      PT LONG TERM GOAL #3   Title Patient will improve TUG to </= 12 seconds to demosntrate reduced fall risk    Baseline 01/02/21: 12.22 sec's, improved from 13 sec's due not fully to goal    Time 4    Period Weeks    Status On-going      PT LONG TERM GOAL #4   Title Patient will be educated on fall prevention strategies to promote reduced fall risk within the home    Baseline Dependent    Time 4    Period Weeks    Status New      PT LONG TERM GOAL #5   Title Patient will improve FGA to >/= 19/30 to demosntrate improved balance and reduced fall risk    Baseline 15/30    Time 4    Period Weeks    Status Revised      PT LONG TERM GOAL #6   Title Patient will demonstrate ability to ascend/descend 8 stairs with single rail and reciprocal pattern Mod I for improved household entry/exit    Baseline 01/02/21: supervision with bil  rails using reciprocal pattern supervision    Time 4    Period  Weeks    Status Revised                 Plan - 01/23/21 0855    Clinical Impression Statement Continued progression of strengthening and balance exercises with focus on increasing single leg stance time.  Pt has a tendency to weightshift posteriorly during exercises so incorporation of rockerboard activities allowed for a better awareness of weightshifting patterns and challeneged balance.  Higher level gait patterns including marches and backwards walking were added with no significant LOB.  Pt able to complete entire session without AD and contact guard.    Personal Factors and Comorbidities Comorbidity 3+;Time since onset of injury/illness/exacerbation    Comorbidities LBP, PAC, Panic Attacks, Depression, MS    Examination-Activity Limitations Bed Mobility;Transfers;Squat;Stairs;Stand;Locomotion Level    Examination-Participation Restrictions Occupation;Cleaning;Community Activity    Stability/Clinical Decision Making Evolving/Moderate complexity    Rehab Potential Good    PT Frequency 2x / week    PT Duration 6 weeks    PT Treatment/Interventions ADLs/Self Care Home Management;Cryotherapy;Moist Heat;DME Instruction;Gait training;Stair training;Functional mobility training;Therapeutic activities;Therapeutic exercise;Balance training;Neuromuscular re-education;Patient/family education;Orthotic Fit/Training;Manual techniques;Passive range of motion;Vestibular;Electrical Stimulation    PT Next Visit Plan Continue SciFit/NuStep as warmup. SLS activities.  Continue balance activites without UE support. Continue BLE standing strengthening. Gait with no device - walking backwards, sidestepping, obstacles.    Consulted and Agree with Plan of Care Patient           Patient will benefit from skilled therapeutic intervention in order to improve the following deficits and impairments:  Abnormal gait,Decreased balance,Decreased  endurance,Decreased mobility,Difficulty walking,Increased muscle spasms,Impaired sensation,Decreased range of motion,Decreased activity tolerance,Decreased knowledge of use of DME,Decreased strength,Impaired flexibility,Pain  Visit Diagnosis: Other abnormalities of gait and mobility  Unsteadiness on feet  Difficulty in walking, not elsewhere classified  Muscle weakness (generalized)     Problem List Patient Active Problem List   Diagnosis Date Noted  . Anxiety 12/16/2017  . Balance problem 02/27/2016  . Paresthesia 02/27/2016    Yetta Numbers, SPT 01/23/2021, 10:03 AM  [facility] [address] [place], [place], [address] Phone: [phone]   Fax:  [phone]  Name: KRISHNA HEUER MRN: [id number] Date of Birth: [birth year]

## 2021-01-25 ENCOUNTER — Ambulatory Visit: Payer: 59 | Attending: Neurology

## 2021-01-25 DIAGNOSIS — R2681 Unsteadiness on feet: Secondary | ICD-10-CM | POA: Diagnosis present

## 2021-01-25 DIAGNOSIS — R2689 Other abnormalities of gait and mobility: Secondary | ICD-10-CM | POA: Diagnosis not present

## 2021-01-25 DIAGNOSIS — M6281 Muscle weakness (generalized): Secondary | ICD-10-CM | POA: Insufficient documentation

## 2021-01-25 DIAGNOSIS — R262 Difficulty in walking, not elsewhere classified: Secondary | ICD-10-CM | POA: Diagnosis present

## 2021-01-25 NOTE — Therapy (Signed)
[facility] [address] [place], [place], [address] Phone: [phone]   Fax:  [phone]  Physical Therapy Treatment  Patient Details  Name: [name] MRN: [id number] Date of Birth: 1966/03/14 Referring Provider (PT): Metta Clines, DO   Encounter Date: 01/25/2021   PT End of Session - 01/25/21 0849    Visit Number 17    Number of Visits 20    Date for PT Re-Evaluation 03/05/21   POC for 6 weeks, Cert for 90 days   Authorization Type UHC (VL: 60)    PT Start Time 0846    PT Stop Time 0930    PT Time Calculation (min) 44 min    Equipment Utilized During Treatment Gait belt    Activity Tolerance Patient tolerated treatment well    Behavior During Therapy [facility] for tasks assessed/performed           Past Medical History:  Diagnosis Date  . Depression   . Heart murmur    as a child  . Hypercholesteremia   . Low back pain   . MS (multiple sclerosis) ([place])   . Neuromuscular disorder ([place])    MS dx 04/2020  . Numbness and tingling 2017   Feet  . PAC (premature atrial contraction) 1998  . Panic attacks     Past Surgical History:  Procedure Laterality Date  . ABDOMINAL HYSTERECTOMY  2007  . cesarian     1 time  . COLONOSCOPY  10/15/2020   [place]  . fibriod removal     uterine    There were no vitals filed for this visit.   Subjective Assessment - 01/25/21 0848    Subjective No new changes/complaints. No falls. No pain to report.    Pertinent History LBP, PAC, Panic Attacks, Depression    Limitations Standing;Walking    How long can you walk comfortably? 1 hr.    Diagnostic tests MRI of the cervical spine without contrast on 02/25/2020 which demonstrated multiple hyperintense lesions throughout the cervical and upper thoracic cord.  Follow up MRI of brain with and without contrast on 03/31/2020 showed multiple T2 hyperintense lesions involving the periventricular, subcortical and juxtacortical  white matter with involvement of the callososeptal interface and brainstem, no enhancemen    Patient Stated Goals learn how to get herself stronger;    Currently in Pain? No/denies               [facility] Adult PT Treatment/Exercise - 01/25/21 0001      Transfers   Transfers Sit to Stand;Stand to Sit    Sit to Stand 5: Supervision    Stand to Sit 5: Supervision;Without upper extremity assist;To bed;To chair/3-in-1    Comments completed sit <> stands with red balance beam under BLE x 10 reps, using UE support on legs. CGA throughout completion.      Ambulation/Gait   Ambulation/Gait Yes    Ambulation/Gait Assistance 5: Supervision    Ambulation/Gait Assistance Details throughout therapy gym with activities    Ambulation Distance (Feet) --   clinic distance   Assistive device None    Gait Pattern Step-through pattern;Decreased stance time - left;Decreased step length - right    Ambulation Surface Level;Indoor      High Level Balance   High Level Balance Activities Marching forwards;Side stepping;Backward walking    High Level Balance Comments around therapy gym on level surface: completed marching forwards approx 25 ft  x 3 laps with naming fruits/veggies by color,  then followed by side steppign with naming animals by starting letter x 3 laps. Then completed x 2 laps down and back of backwards walking. Intermitent CGA required with completion.      Exercises   Exercises Knee/Hip      Knee/Hip Exercises: Aerobic   Other Aerobic SciFit Level 3.0 for 8 minutes; BUE/BLE work, for strengthening and improving activity tolerance.            Balance Exercises - 01/25/21 0001      Balance Exercises: Standing   Stepping Strategy Posterior;Foam/compliant surface;Limitations    Stepping Strategy Limitations standing on blue balance beam completed alternating posterior stepping strategy x 15 reps. increased challenge wtih RLE > LLE noted. intermittent touch A to // bars for stability.     Tandem Gait Forward;Intermittent upper extremity support;Retro;4 reps;Limitations    Tandem Gait Limitations in // bars with intermittent UE support completed alternating forward/retro tandem gait, increased challenge with retro tandem gait. CGA    Other Standing Exercises At countertop on blue/red mat along with orange hurdles completed negotiating/stepping over obstacle, initially started with step to pattern over obstacle without UE support x 3 laps down and back, then progressed to reciprocal step pattern over hurdles, intermittent UE suppotr and CGA requried.              PT Short Term Goals - 01/04/21 1045      PT SHORT TERM GOAL #1   Title = LTGs             PT Long Term Goals - 01/04/21 1047      PT LONG TERM GOAL #1   Title Patient will be independent with final HEP for strengthening/balance (All LTGs Due: 02/01/21)    Baseline 01/02/21: met with current HEP, will benefit from advancement as pt progresses as she reports current program is challenging.    Time 4    Period Weeks    Status On-going    Target Date 02/01/21      PT LONG TERM GOAL #2   Title Pt will improve BERG score to at least a 52/56 in order to demo decr fall risk.    Baseline 01/02/21: 50/56 scored, improved just not to goal level    Time 4    Period Weeks    Status On-going      PT LONG TERM GOAL #3   Title Patient will improve TUG to </= 12 seconds to demosntrate reduced fall risk    Baseline 01/02/21: 12.22 sec's, improved from 13 sec's due not fully to goal    Time 4    Period Weeks    Status On-going      PT LONG TERM GOAL #4   Title Patient will be educated on fall prevention strategies to promote reduced fall risk within the home    Baseline Dependent    Time 4    Period Weeks    Status New      PT LONG TERM GOAL #5   Title Patient will improve FGA to >/= 19/30 to demosntrate improved balance and reduced fall risk    Baseline 15/30    Time 4    Period Weeks    Status Revised      PT  LONG TERM GOAL #6   Title Patient will demonstrate ability to ascend/descend 8 stairs with single rail and reciprocal pattern Mod I for improved household entry/exit    Baseline 01/02/21: supervision with bil rails using reciprocal pattern supervision  Time 4    Period Weeks    Status Revised                 Plan - 01/25/21 1103    Clinical Impression Statement Today's skilled PT session focused on continued balance exercises working toward narrow BOS, SLS and improved dual tasking with ambulation. Patient continues to demo progress with reduced use of AD throughout session. Will continue to progress toward all LTGs.    Personal Factors and Comorbidities Comorbidity 3+;Time since onset of injury/illness/exacerbation    Comorbidities LBP, PAC, Panic Attacks, Depression, MS    Examination-Activity Limitations Bed Mobility;Transfers;Squat;Stairs;Stand;Locomotion Level    Examination-Participation Restrictions Occupation;Cleaning;Community Activity    Stability/Clinical Decision Making Evolving/Moderate complexity    Rehab Potential Good    PT Frequency 2x / week    PT Duration 6 weeks    PT Treatment/Interventions ADLs/Self Care Home Management;Cryotherapy;Moist Heat;DME Instruction;Gait training;Stair training;Functional mobility training;Therapeutic activities;Therapeutic exercise;Balance training;Neuromuscular re-education;Patient/family education;Orthotic Fit/Training;Manual techniques;Passive range of motion;Vestibular;Electrical Stimulation    PT Next Visit Plan Continue SciFit/NuStep as warmup. SLS activities.  Continue balance activites without UE support. Continue BLE standing strengthening. Gait with no device - walking backwards, sidestepping, obstacles.    Consulted and Agree with Plan of Care Patient           Patient will benefit from skilled therapeutic intervention in order to improve the following deficits and impairments:  Abnormal gait,Decreased balance,Decreased  endurance,Decreased mobility,Difficulty walking,Increased muscle spasms,Impaired sensation,Decreased range of motion,Decreased activity tolerance,Decreased knowledge of use of DME,Decreased strength,Impaired flexibility,Pain  Visit Diagnosis: Other abnormalities of gait and mobility  Unsteadiness on feet  Difficulty in walking, not elsewhere classified  Muscle weakness (generalized)     Problem List Patient Active Problem List   Diagnosis Date Noted  . Anxiety 12/16/2017  . Balance problem 02/27/2016  . Paresthesia 02/27/2016    Jones Bales, PT, DPT 01/25/2021, 9:34 AM  [facility] [address] [place], [place], [address] Phone: [phone]   Fax:  [phone]  Name: JUAN OLTHOFF MRN: [id number] Date of Birth: 02-Mar-1966

## 2021-01-30 ENCOUNTER — Encounter: Payer: Self-pay | Admitting: Physical Therapy

## 2021-01-30 ENCOUNTER — Other Ambulatory Visit: Payer: Self-pay

## 2021-01-30 ENCOUNTER — Ambulatory Visit: Payer: 59 | Admitting: Physical Therapy

## 2021-01-30 DIAGNOSIS — M6281 Muscle weakness (generalized): Secondary | ICD-10-CM

## 2021-01-30 DIAGNOSIS — R2689 Other abnormalities of gait and mobility: Secondary | ICD-10-CM

## 2021-01-30 DIAGNOSIS — R262 Difficulty in walking, not elsewhere classified: Secondary | ICD-10-CM

## 2021-01-30 DIAGNOSIS — R2681 Unsteadiness on feet: Secondary | ICD-10-CM

## 2021-01-30 NOTE — Patient Instructions (Addendum)
Fall Prevention in the Home, Adult Falls can cause injuries and can happen to people of all ages. There are many things you can do to make your home safe and to help prevent falls. Ask for help when making these changes. What actions can I take to prevent falls? General Instructions  Use good lighting in all rooms. Replace any light bulbs that burn out.  Turn on the lights in dark areas. Use night-lights.  Keep items that you use often in easy-to-reach places. Lower the shelves around your home if needed.  Set up your furniture so you have a clear path. Avoid moving your furniture around.  Do not have throw rugs or other things on the floor that can make you trip.  Avoid walking on wet floors.  If any of your floors are uneven, fix them.  Add color or contrast paint or tape to clearly mark and help you see: ? Grab bars or handrails. ? First and last steps of staircases. ? Where the edge of each step is.  If you use a stepladder: ? Make sure that it is fully opened. Do not climb a closed stepladder. ? Make sure the sides of the stepladder are locked in place. ? Ask someone to hold the stepladder while you use it.  Know where your pets are when moving through your home. What can I do in the bathroom?  Keep the floor dry. Clean up any water on the floor right away.  Remove soap buildup in the tub or shower.  Use nonskid mats or decals on the floor of the tub or shower.  Attach bath mats securely with double-sided, nonslip rug tape.  If you need to sit down in the shower, use a plastic, nonslip stool.  Install grab bars by the toilet and in the tub and shower. Do not use towel bars as grab bars.      What can I do in the bedroom?  Make sure that you have a light by your bed that is easy to reach.  Do not use any sheets or blankets for your bed that hang to the floor.  Have a firm chair with side arms that you can use for support when you get dressed. What can I do in  the kitchen?  Clean up any spills right away.  If you need to reach something above you, use a step stool with a grab bar.  Keep electrical cords out of the way.  Do not use floor polish or wax that makes floors slippery. What can I do with my stairs?  Do not leave any items on the stairs.  Make sure that you have a light switch at the top and the bottom of the stairs.  Make sure that there are handrails on both sides of the stairs. Fix handrails that are broken or loose.  Install nonslip stair treads on all your stairs.  Avoid having throw rugs at the top or bottom of the stairs.  Choose a carpet that does not hide the edge of the steps on the stairs.  Check carpeting to make sure that it is firmly attached to the stairs. Fix carpet that is loose or worn. What can I do on the outside of my home?  Use bright outdoor lighting.  Fix the edges of walkways and driveways and fix any cracks.  Remove anything that might make you trip as you walk through a door, such as a raised step or threshold.  Trim any   bushes or trees on paths to your home.  Check to see if handrails are loose or broken and that both sides of all steps have handrails.  Install guardrails along the edges of any raised decks and porches.  Clear paths of anything that can make you trip, such as tools or rocks.  Have leaves, snow, or ice cleared regularly.  Use sand or salt on paths during winter.  Clean up any spills in your garage right away. This includes grease or oil spills. What other actions can I take?  Wear shoes that: ? Have a low heel. Do not wear high heels. ? Have rubber bottoms. ? Feel good on your feet and fit well. ? Are closed at the toe. Do not wear open-toe sandals.  Use tools that help you move around if needed. These include: ? Canes. ? Walkers. ? Scooters. ? Crutches.  Review your medicines with your doctor. Some medicines can make you feel dizzy. This can increase your chance  of falling. Ask your doctor what else you can do to help prevent falls. Where to find more information  Centers for Disease Control and Prevention, [employer]: www.cdc.gov  National Institute on Aging: www.nia.nih.gov Contact a doctor if:  You are afraid of falling at home.  You feel weak, drowsy, or dizzy at home.  You fall at home. Summary  There are many simple things that you can do to make your home safe and to help prevent falls.  Ways to make your home safe include removing things that can make you trip and installing grab bars in the bathroom.  Ask for help when making these changes in your home. This information is not intended to replace advice given to you by your health care provider. Make sure you discuss any questions you have with your health care provider. Document Revised: 05/16/2020 Document Reviewed: 05/16/2020 Elsevier Patient Education  2021 Elsevier Inc.  

## 2021-01-30 NOTE — Therapy (Signed)
[facility] [address] [place], [place], [address] Phone: [phone]   Fax:  [phone]  Physical Therapy Treatment  Patient Details  Name: PRINCELLA JASKIEWICZ MRN: [id number] Date of Birth: 1965/12/29 Referring Provider (PT): Metta Clines, DO   Encounter Date: 01/30/2021   PT End of Session - 01/30/21 0851    Visit Number 18    Number of Visits 20    Date for PT Re-Evaluation 03/05/21   POC for 6 weeks, Cert for 90 days   Authorization Type UHC (VL: 60)    PT Start Time 0849    PT Stop Time 0930    PT Time Calculation (min) 41 min    Equipment Utilized During Treatment Gait belt    Activity Tolerance Patient tolerated treatment well    Behavior During Therapy [facility] for tasks assessed/performed           Past Medical History:  Diagnosis Date  . Depression   . Heart murmur    as a child  . Hypercholesteremia   . Low back pain   . MS (multiple sclerosis) ([place])   . Neuromuscular disorder ([place])    MS dx 04/2020  . Numbness and tingling 2017   Feet  . PAC (premature atrial contraction) 1998  . Panic attacks     Past Surgical History:  Procedure Laterality Date  . ABDOMINAL HYSTERECTOMY  2007  . cesarian     1 time  . COLONOSCOPY  10/15/2020   [place]  . fibriod removal     uterine    There were no vitals filed for this visit.   Subjective Assessment - 01/30/21 0850    Subjective No new complaints. No falls or pain to report.    Pertinent History LBP, PAC, Panic Attacks, Depression    Limitations Standing;Walking    How long can you walk comfortably? 1 hr.    Diagnostic tests MRI of the cervical spine without contrast on 02/25/2020 which demonstrated multiple hyperintense lesions throughout the cervical and upper thoracic cord.  Follow up MRI of brain with and without contrast on 03/31/2020 showed multiple T2 hyperintense lesions involving the periventricular, subcortical and juxtacortical white  matter with involvement of the callososeptal interface and brainstem, no enhancemen    Patient Stated Goals learn how to get herself stronger;    Currently in Pain? No/denies              [facility] PT Assessment - 01/30/21 0853      Berg Balance Test   Sit to Stand Able to stand without using hands and stabilize independently    Standing Unsupported Able to stand safely 2 minutes    Sitting with Back Unsupported but Feet Supported on Floor or Stool Able to sit safely and securely 2 minutes    Stand to Sit Sits safely with minimal use of hands    Transfers Able to transfer safely, minor use of hands    Standing Unsupported with Eyes Closed Able to stand 10 seconds safely    Standing Unsupported with Feet Together Able to place feet together independently and stand 1 minute safely    From Standing, Reach Forward with Outstretched Arm Can reach confidently >25 cm (10")    From Standing Position, Pick up Object from Floor Able to pick up shoe safely and easily    From Standing Position, Turn to Look Behind Over each Shoulder Looks behind from both sides and weight shifts well  Turn 360 Degrees Able to turn 360 degrees safely in 4 seconds or less   2-3 sec's each way   Standing Unsupported, Alternately Place Feet on Step/Stool Able to stand independently and safely and complete 8 steps in 20 seconds   14.16 sec's   Standing Unsupported, One Foot in Front Able to plae foot ahead of the other independently and hold 30 seconds    Standing on One Leg Able to lift leg independently and hold equal to or more than 3 seconds    Total Score 53    Berg comment: 53/56= lower risk for falls      Timed Up and Go Test   TUG Normal TUG    Normal TUG (seconds) 11.6   no AD     Functional Gait  Assessment   Gait assessed  Yes    Gait Level Surface Walks 20 ft in less than 7 sec but greater than 5.5 sec, uses assistive device, slower speed, mild gait deviations, or deviates 6-10 in outside of the 12 in  walkway width.   6.72 sec's   Change in Gait Speed Able to smoothly change walking speed without loss of balance or gait deviation. Deviate no more than 6 in outside of the 12 in walkway width.    Gait with Horizontal Head Turns Performs head turns smoothly with slight change in gait velocity (eg, minor disruption to smooth gait path), deviates 6-10 in outside 12 in walkway width, or uses an assistive device.    Gait with Vertical Head Turns Performs head turns with no change in gait. Deviates no more than 6 in outside 12 in walkway width.    Gait and Pivot Turn Pivot turns safely within 3 sec and stops quickly with no loss of balance.    Step Over Obstacle Is able to step over 2 stacked shoe boxes taped together (9 in total height) without changing gait speed. No evidence of imbalance.    Gait with Narrow Base of Support Ambulates 4-7 steps.    Gait with Eyes Closed Walks 20 ft, slow speed, abnormal gait pattern, evidence for imbalance, deviates 10-15 in outside 12 in walkway width. Requires more than 9 sec to ambulate 20 ft.   12.32 sec's   Ambulating Backwards Walks 20 ft, uses assistive device, slower speed, mild gait deviations, deviates 6-10 in outside 12 in walkway width.    Steps Alternating feet, must use rail.    Total Score 22    FGA comment: 22/30= medium fall risk                 [facility] Adult PT Treatment/Exercise - 01/30/21 0853      Transfers   Transfers Sit to Stand;Stand to Sit    Sit to Stand 5: Supervision;Without upper extremity assist;From bed;From chair/3-in-1    Stand to Sit 5: Supervision;Without upper extremity assist;To bed;To chair/3-in-1      Ambulation/Gait   Ambulation/Gait Yes    Ambulation/Gait Assistance 5: Supervision    Ambulation/Gait Assistance Details around gym with session    Assistive device None;Straight cane    Gait Pattern Step-through pattern;Decreased stance time - left;Decreased step length - right    Ambulation Surface Level;Indoor     Stairs Yes    Stairs Assistance 6: Modified independent (Device/Increase time)    Stairs Assistance Details (indicate cue type and reason) no cues or assistance needed    Stair Management Technique One rail Left;Alternating pattern;Forwards    Number of Stairs 4  x2 reps   Height of Stairs 6      Self-Care   Self-Care Other Self-Care Comments    Other Self-Care Comments  reviewed and issued fall prevention strategies. Handout provided. Pt with no questions after education                PT Short Term Goals - 01/04/21 1045      PT SHORT TERM GOAL #1   Title = LTGs             PT Long Term Goals - 01/30/21 0851      PT LONG TERM GOAL #1   Title Patient will be independent with final HEP for strengthening/balance (All LTGs Due: 02/01/21)    Baseline 01/02/21: met with current HEP, will benefit from advancement as pt progresses as she reports current program is challenging.    Time 4    Period Weeks    Status On-going      PT LONG TERM GOAL #2   Title Pt will improve BERG score to at least a 52/56 in order to demo decr fall risk.    Baseline 01/30/21: 53/56 scored today    Time --    Period --    Status Achieved      PT LONG TERM GOAL #3   Title Patient will improve TUG to </= 12 seconds to demosntrate reduced fall risk    Baseline 34/6/22: 11.60 sec's no AD    Time --    Period --    Status Achieved      PT LONG TERM GOAL #4   Title Patient will be educated on fall prevention strategies to promote reduced fall risk within the home    Baseline 01/30/21: met in session today    Time --    Period --    Status Achieved      PT LONG TERM GOAL #5   Title Patient will improve FGA to >/= 19/30 to demosntrate improved balance and reduced fall risk    Baseline 01/30/21: 22/30 scored today    Time --    Period --    Status Achieved      PT LONG TERM GOAL #6   Title Patient will demonstrate ability to ascend/descend 8 stairs with single rail and reciprocal pattern Mod I for  improved household entry/exit    Baseline 01/30/21: met in session today    Time --    Period --    Status Achieved                 Plan - 01/30/21 0851    Clinical Impression Statement Today's skilled session focused on progress toward LTGs with all goals checked met. Pt improved her Berg Balance Test score to 52/56, her Dyanmic Gait Index score to 22/30, her Timed Up and Go to 11.60 sec's with no AD and met her stair goal as well. Discussed progress toward goals with pt agreeable to discharge at next session.    Personal Factors and Comorbidities Comorbidity 3+;Time since onset of injury/illness/exacerbation    Comorbidities LBP, PAC, Panic Attacks, Depression, MS    Examination-Activity Limitations Bed Mobility;Transfers;Squat;Stairs;Stand;Locomotion Level    Examination-Participation Restrictions Occupation;Cleaning;Community Activity    Stability/Clinical Decision Making Evolving/Moderate complexity    Rehab Potential Good    PT Frequency 2x / week    PT Duration 6 weeks    PT Treatment/Interventions ADLs/Self Care Home Management;Cryotherapy;Moist Heat;DME Instruction;Gait training;Stair training;Functional mobility training;Therapeutic activities;Therapeutic exercise;Balance training;Neuromuscular re-education;Patient/family education;Orthotic Fit/Training;Manual techniques;Passive  range of motion;Vestibular;Electrical Stimulation    PT Next Visit Plan Check remaining LTG for HEP for anticipated discharge    Consulted and Agree with Plan of Care Patient           Patient will benefit from skilled therapeutic intervention in order to improve the following deficits and impairments:  Abnormal gait,Decreased balance,Decreased endurance,Decreased mobility,Difficulty walking,Increased muscle spasms,Impaired sensation,Decreased range of motion,Decreased activity tolerance,Decreased knowledge of use of DME,Decreased strength,Impaired flexibility,Pain  Visit Diagnosis: Other  abnormalities of gait and mobility  Unsteadiness on feet  Difficulty in walking, not elsewhere classified  Muscle weakness (generalized)     Problem List Patient Active Problem List   Diagnosis Date Noted  . Anxiety 12/16/2017  . Balance problem 02/27/2016  . Paresthesia 02/27/2016    Willow Ora, PTA, [facility] [address], [place], [place] [address] [phone] 01/30/21, 9:31 PM   Name: CONNER NEISS MRN: [id number] Date of Birth: April 20, 1966

## 2021-02-01 ENCOUNTER — Ambulatory Visit: Payer: 59

## 2021-02-01 ENCOUNTER — Other Ambulatory Visit: Payer: Self-pay

## 2021-02-01 DIAGNOSIS — R2689 Other abnormalities of gait and mobility: Secondary | ICD-10-CM | POA: Diagnosis not present

## 2021-02-01 DIAGNOSIS — M6281 Muscle weakness (generalized): Secondary | ICD-10-CM

## 2021-02-01 DIAGNOSIS — R262 Difficulty in walking, not elsewhere classified: Secondary | ICD-10-CM

## 2021-02-01 DIAGNOSIS — R2681 Unsteadiness on feet: Secondary | ICD-10-CM

## 2021-02-01 NOTE — Patient Instructions (Signed)
Access Code: [email or web address] URL: [email or web address] Date: 02/01/2021 Prepared by: Baldomero Lamy  Exercises Sit to Stand with Arms Crossed - 1 x daily - 4 x weekly - 2 sets - 10 reps Standing Hip Abduction with Counter Support - 1 x daily - 4 x weekly - 2 sets - 10 reps Standing Hip Extension with Counter Support - 1 x daily - 4 x weekly - 2 sets - 10 reps Mini Squat with Counter Support - 1 x daily - 4 x weekly - 2 sets - 10 reps Alternating Step Taps with Counter Support - 1 x daily - 4 x weekly - 2 sets - 10 reps Romberg Stance - 1 x daily - 4 x weekly - 2 sets - 10 reps Romberg Stance with Eyes Closed - 1 x daily - 4 x weekly - 1 sets - 3 reps - 30 seconds hold Standing Tandem Balance with Counter Support - 1 x daily - 4 x weekly - 1 sets - 3 reps - 15 seconds hold

## 2021-02-01 NOTE — Therapy (Signed)
[facility] [address] [place], [place], [address] Phone: [phone]   Fax:  [phone]  Physical Therapy Treatment/Discharge Summary  Patient Details  Name: [name] MRN: [id number] Date of Birth: 10-28-1965 Referring Provider (PT): Metta Clines, DO  PHYSICAL THERAPY DISCHARGE SUMMARY  Visits from Start of Care: 19  Current functional level related to goals / functional outcomes: See Clinical Impression Statement    Remaining deficits: Low Fall Risk   Education / Equipment: HEP; Fall Prevention  Plan: Patient agrees to discharge.  Patient goals were met. Patient is being discharged due to meeting the stated rehab goals.  ?????       Encounter Date: 02/01/2021   PT End of Session - 02/01/21 0851    Visit Number 19    Number of Visits 20    Date for PT Re-Evaluation 03/05/21   POC for 6 weeks, Cert for 90 days   Authorization Type U[employer] (VL: 73)    PT Start Time 0847    PT Stop Time 0923    PT Time Calculation (min) 36 min    Equipment Utilized During Treatment Gait belt    Activity Tolerance Patient tolerated treatment well    Behavior During Therapy WFL for tasks assessed/performed           Past Medical History:  Diagnosis Date  . Depression   . Heart murmur    as a child  . Hypercholesteremia   . Low back pain   . MS (multiple sclerosis) ([place])   . Neuromuscular disorder ([place])    MS dx 04/2020  . Numbness and tingling 2017   Feet  . PAC (premature atrial contraction) 1998  . Panic attacks     Past Surgical History:  Procedure Laterality Date  . ABDOMINAL HYSTERECTOMY  2007  . cesarian     1 time  . COLONOSCOPY  10/15/2020   [place]  . fibriod removal     uterine    There were no vitals filed for this visit.   Subjective Assessment - 02/01/21 0850    Subjective No new changes. No falls. No pain.    Pertinent History LBP, PAC, Panic Attacks, Depression     Limitations Standing;Walking    How long can you walk comfortably? 1 hr.    Diagnostic tests MRI of the cervical spine without contrast on 02/25/2020 which demonstrated multiple hyperintense lesions throughout the cervical and upper thoracic cord.  Follow up MRI of brain with and without contrast on 03/31/2020 showed multiple T2 hyperintense lesions involving the periventricular, subcortical and juxtacortical white matter with involvement of the callososeptal interface and brainstem, no enhancemen    Patient Stated Goals learn how to get herself stronger;    Currently in Pain? No/denies               [facility] Adult PT Treatment/Exercise - 02/01/21 0001      Transfers   Transfers Sit to Stand;Stand to Sit    Sit to Stand 6: Modified independent (Device/Increase time)    Stand to Sit 6: Modified independent (Device/Increase time)    Comments completed sit <> stands x 10 reps without UE support      Ambulation/Gait   Ambulation/Gait Yes    Ambulation/Gait Assistance 5: Supervision    Ambulation/Gait Assistance Details throughout session    Ambulation Distance (Feet) --   clinic distances   Assistive device None    Gait Pattern Step-through pattern;Decreased stance time - left;Decreased  step length - right    Ambulation Surface Level;Indoor      Exercises   Exercises Other Exercises    Other Exercises  Reviewed and Updated HEP (See Below/Medbridge Program for Details)           Completed all of the following exercises during session, patient tolerating progression of reps/sets well. New additions bolded. Patient verbalize independence with HEP.     Access Code: [email or web address] URL: [email or web address] Date: 02/01/2021 Prepared by: Baldomero Lamy  Exercises Sit to Stand with Arms Crossed - 1 x daily - 4 x weekly - 2 sets - 10 reps Standing Hip Abduction with Counter Support - 1 x daily - 4 x weekly - 2 sets - 10 reps Standing Hip Extension with Counter Support - 1 x daily -  4 x weekly - 2 sets - 10 reps Mini Squat with Counter Support - 1 x daily - 4 x weekly - 2 sets - 10 reps Alternating Step Taps with Counter Support - 1 x daily - 4 x weekly - 2 sets - 10 reps Romberg Stance - 1 x daily - 4 x weekly - 2 sets - 10 reps Romberg Stance with Eyes Closed - 1 x daily - 4 x weekly - 1 sets - 3 reps - 30 seconds hold Standing Tandem Balance with Counter Support - 1 x daily - 4 x weekly - 1 sets - 3 reps - 15 seconds hold       PT Education - 02/01/21 0852    Education Details Updated HEP: progress toward LTGs    Person(s) Educated Patient    Methods Explanation;Demonstration;Handout    Comprehension Verbalized understanding;Returned demonstration            PT Short Term Goals - 01/04/21 1045      PT SHORT TERM GOAL #1   Title = LTGs             PT Long Term Goals - 02/01/21 8916      PT LONG TERM GOAL #1   Title Patient will be independent with final HEP for strengthening/balance (All LTGs Due: 02/01/21)    Baseline 01/02/21: met with current HEP, will benefit from advancement as pt progresses as she reports current program is challenging. 02/01/21: updated HEP, patient reports independence with final HEP    Time 4    Period Weeks    Status Achieved      PT LONG TERM GOAL #2   Title Pt will improve BERG score to at least a 52/56 in order to demo decr fall risk.    Baseline 01/30/21: 53/56 scored today    Status Achieved      PT LONG TERM GOAL #3   Title Patient will improve TUG to </= 12 seconds to demosntrate reduced fall risk    Baseline 34/6/22: 11.60 sec's no AD    Status Achieved      PT LONG TERM GOAL #4   Title Patient will be educated on fall prevention strategies to promote reduced fall risk within the home    Baseline 01/30/21: met in session today    Status Achieved      PT LONG TERM GOAL #5   Title Patient will improve FGA to >/= 19/30 to demosntrate improved balance and reduced fall risk    Baseline 01/30/21: 22/30 scored today     Status Achieved      PT LONG TERM GOAL #6   Title Patient will demonstrate ability to ascend/descend 8  stairs with single rail and reciprocal pattern Mod I for improved household entry/exit    Baseline 01/30/21: met in session today    Status Achieved                 Plan - 02/01/21 0925    Clinical Impression Statement Today's session focused on reviewing and updating HEP prior to D/C. Patient tolerating progression of strength/balanace exercises well with new handout provided. Patient demosntrating ability to meet all LTGs, demonstating significant progress with PT services. Patient also demonstrating reduced fall risk with Berg Balance score of 52/56, and DGI of 22/30. PT educating on readiness for d/c with patient verbalizing agreement.    Personal Factors and Comorbidities Comorbidity 3+;Time since onset of injury/illness/exacerbation    Comorbidities LBP, PAC, Panic Attacks, Depression, MS    Examination-Activity Limitations Bed Mobility;Transfers;Squat;Stairs;Stand;Locomotion Level    Examination-Participation Restrictions Occupation;Cleaning;Community Activity    Stability/Clinical Decision Making Evolving/Moderate complexity    Rehab Potential Good    PT Frequency 2x / week    PT Duration 6 weeks    PT Treatment/Interventions ADLs/Self Care Home Management;Cryotherapy;Moist Heat;DME Instruction;Gait training;Stair training;Functional mobility training;Therapeutic activities;Therapeutic exercise;Balance training;Neuromuscular re-education;Patient/family education;Orthotic Fit/Training;Manual techniques;Passive range of motion;Vestibular;Electrical Stimulation    Consulted and Agree with Plan of Care Patient           Patient will benefit from skilled therapeutic intervention in order to improve the following deficits and impairments:  Abnormal gait,Decreased balance,Decreased endurance,Decreased mobility,Difficulty walking,Increased muscle spasms,Impaired sensation,Decreased  range of motion,Decreased activity tolerance,Decreased knowledge of use of DME,Decreased strength,Impaired flexibility,Pain  Visit Diagnosis: Other abnormalities of gait and mobility  Unsteadiness on feet  Difficulty in walking, not elsewhere classified  Muscle weakness (generalized)     Problem List Patient Active Problem List   Diagnosis Date Noted  . Anxiety 12/16/2017  . Balance problem 02/27/2016  . Paresthesia 02/27/2016    Jones Bales, PT, DPT 02/01/2021, 9:28 AM  [facility] [address] [place], [place], [address] Phone: [phone]   Fax:  [phone]  Name: [name] MRN: [id number] Date of Birth: [birth year]

## 2021-04-23 DIAGNOSIS — G35D Multiple sclerosis, unspecified: Secondary | ICD-10-CM | POA: Insufficient documentation

## 2021-05-20 NOTE — Progress Notes (Addendum)
NEUROLOGY FOLLOW UP OFFICE NOTE  [name] [id number]  Assessment/Plan:   Multiple sclerosis  DMT:  De Nurse D3 2000 IU daily Check labs today and in 6 months:  vit D and quantitative immunoglobulin panel.  IgM in January was 20.  If it is in the mid-teens today, I would dose Ocrevus every 8 months. Repeat MRI of brain/cervical/thoracic spine with and without contrast in 6 months Follow up 6 months (after repeat labs and MRI)  ADDENDUM:  IgM is 18 (IgA and IgM are normal at 138 and 1,172 respectively).  Therefore I would like to extend duration between Ocrevus infusions to every 8 months.  Vit D level is 38.51.  I would like her to increase D3 to 4000 IU daily.  Subjective:  [name] is a 55 year old right-handed female who follows up for multiple sclerosis.   UPDATE: Current DMT:  Ocrevus (started in August) Current medications:  D3 2000 IU daily, ibuprofen '600mg'$  PRN(for leg soreness)   MRI of brain with and without contrast on 12/02/2020 showed stable demyelination (more conspicuous than on 03/31/2020, but previously not performed on 3 tesla magnet).  Stable demyelination of the cervical and upper thoracic cord compared to 03/31/2020.  No abnormal enhancement.  Physical therapy was helpful  Labs on 11/15/2020:  IgA 136, IgG 1,260 and low IgM of 20; Vit D 49.19   Vision:  No issues Motor:  Feels weak due to pain in legs with prolonged walking Sensory:  No issues Pain:  Legs still sore. Feels like a muscle spasm.  Pain over her left eye resolved. Gait:  Unsteady when legs hurt Bowel/Bladder:  No issues Fatigue:  No issues Cognition:  No issues Mood:  Some anxiety.   She notes increased sex drive.     HISTORY:  She began having some balance problems and vision problems when she was in her early 53s.   In 2017, she began experiencing bilateral foot numbness and tingling as well as balance problems.  She saw neurology and had a NCV-EMG on 03/18/2016 which was  normal.  Labs demonstrated negative ANA, sed rate 11, elevated CRP 7.4, B12 315, folate 9.2, non-reactive RPR, TSH 2.280, Hgb A1c 5.7 and low vit D 9.7.  She couldn't tolerate gabapentin   In March 2021, she began having left periorbital eye pain, not with movement, but aggravated when rubbing the eye.  She also notes blurred vision in the left eye.  She saw the eye doctor and was found only to have dry eye.     Since 2020 she has had left sided neck pain that radiates down the left arm with numbness in the fingers, aggravated with certain body movements.  No weakness.  She had an MRI of the cervical spine without contrast on 02/25/2020 which demonstrated multiple hyperintense lesions throughout the cervical and upper thoracic cord.  Follow up MRI of brain with and without contrast on 03/31/2020 showed multiple T2 hyperintense lesions involving the periventricular, subcortical and juxtacortical white matter with involvement of the callososeptal interface and brainstem, no enhancement.     No family history of MS.  However, her mother has RA.   Past medications:  Gabapentin (caused confusion)  JC Virus antibody was positive with low index of 0.4.    PAST MEDICAL HISTORY: Past Medical History:  Diagnosis Date   Depression    Heart murmur    as a child   Hypercholesteremia    Low back pain    MS (  multiple sclerosis) ([facility])    Neuromuscular disorder ([facility])    MS dx 04/2020   Numbness and tingling 2017   Feet   PAC (premature atrial contraction) 1998   Panic attacks     MEDICATIONS: Current Outpatient Medications on File Prior to Visit  Medication Sig Dispense Refill   aspirin 325 MG tablet Take 325 mg by mouth daily as needed. pain     Baclofen 5 MG TABS Take '5mg'$  three times daily as needed for muscle spasms. 90 tablet 5   furosemide (LASIX) 20 MG tablet Take 20 mg by mouth every morning.     ibuprofen (ADVIL,MOTRIN) 600 MG tablet Take 1 tablet (600 mg total) by mouth every 6 (six) hours as  needed. (Patient not taking: Reported on 11/21/2020) 30 tablet 0   ocrelizumab 600 mg in sodium chloride 0.9 % 500 mL Inject 600 mg into the vein once.     No current facility-administered medications on file prior to visit.    ALLERGIES: Allergies  Allergen Reactions   Shellfish-Derived Products Hives    FAMILY HISTORY: Family History  Problem Relation Age of Onset   Seizures Mother    Arthritis Mother    Heart disease Maternal Grandfather    Heart disease Maternal Grandmother    Hypertension Sister    COPD Brother    Colon cancer Neg Hx    Colon polyps Neg Hx    Esophageal cancer Neg Hx    Stomach cancer Neg Hx    Rectal cancer Neg Hx       Objective:  Blood pressure 131/70, pulse 77, height 5' (1.524 m), weight 186 lb 6 oz (84.5 kg), SpO2 99 %. General: No acute distress.  Patient appears well-groomed.   Head:  Normocephalic/atraumatic Eyes:  Fundi examined but not visualized Neck: supple, no paraspinal tenderness, full range of motion Heart:  Regular rate and rhythm Lungs:  Clear to auscultation bilaterally Back: No paraspinal tenderness Neurological Exam: alert and oriented to person, place, and time.  Speech fluent and not dysarthric, language intact.  CN II-XII intact. Bulk and tone normal, muscle strength 5-/5 right hip flexion, otherwise 5/5 throughout.  Sensation to pinprick and vibration intact.  Deep tendon reflexes 3+ throughout, nonsustained clonus in ankles, toes downgoing.  Finger to nose and heel to shin testing intact.  Mildly wide-based gait. Ambulates with cane.  Romberg with sway.   Metta Clines, DO  CC: Daiva Eves, MD

## 2021-05-21 ENCOUNTER — Encounter: Payer: Self-pay | Admitting: Neurology

## 2021-05-21 ENCOUNTER — Ambulatory Visit (INDEPENDENT_AMBULATORY_CARE_PROVIDER_SITE_OTHER): Payer: 59 | Admitting: Neurology

## 2021-05-21 ENCOUNTER — Other Ambulatory Visit (INDEPENDENT_AMBULATORY_CARE_PROVIDER_SITE_OTHER): Payer: 59

## 2021-05-21 ENCOUNTER — Other Ambulatory Visit: Payer: Self-pay

## 2021-05-21 DIAGNOSIS — G35 Multiple sclerosis: Secondary | ICD-10-CM

## 2021-05-21 NOTE — Patient Instructions (Addendum)
Continue Ocrevus Check labs today and again in 6 months:  Vitamin D level, quantitative immunoglobulin panel Continue D3 2000 IU daily Repeat MRI of brain and cervical/thoracic spine with and without contrast in 6 months Follow up 6 months (after repeat MRI and and labs)

## 2021-05-22 LAB — VITAMIN D 25 HYDROXY (VIT D DEFICIENCY, FRACTURES): VITD: 38.51 ng/mL (ref 30.00–100.00)

## 2021-05-22 LAB — IGG, IGA, IGM
IgG (Immunoglobin G), Serum: 1172 mg/dL (ref 600–1640)
IgM, Serum: 18 mg/dL — ABNORMAL LOW (ref 50–300)
Immunoglobulin A: 138 mg/dL (ref 47–310)

## 2021-05-27 ENCOUNTER — Telehealth: Payer: Self-pay

## 2021-05-27 NOTE — Telephone Encounter (Signed)
Pt advised of results note,and new order for Ocrevus every 8 months sent to [facility] as well as I called to update the infusion center.

## 2021-05-27 NOTE — Telephone Encounter (Signed)
-----   Message from [name], DO sent at 05/22/2021  7:24 AM EDT ----- Vitamin D level is a little lower than I would like.  Increase D3 to 4000 IU daily.  One of her markers for immunity is getting a little too low, potentially increasing her risk for infection.  Therefore, I would like to extend the duration between doses of Ocrevus from every 6 months to every 8 months.  Please verify date of last infusion and I would like her next infusion to be 8 months from then.

## 2021-05-30 NOTE — Telephone Encounter (Signed)
Contacted patients pharmacy, no additional pa is needed for ocrevus though change in treatment q8 months now, reference call number case number [phone].

## 2021-07-12 ENCOUNTER — Encounter (HOSPITAL_COMMUNITY): Payer: Self-pay

## 2021-07-12 ENCOUNTER — Emergency Department (HOSPITAL_COMMUNITY)
Admission: EM | Admit: 2021-07-12 | Discharge: 2021-07-12 | Disposition: A | Discharge status: Home / Self Care | Attending: Emergency Medicine | Admitting: Emergency Medicine

## 2021-07-12 ENCOUNTER — Other Ambulatory Visit: Payer: Self-pay

## 2021-07-12 ENCOUNTER — Emergency Department (HOSPITAL_COMMUNITY)

## 2021-07-12 DIAGNOSIS — W19XXXA Unspecified fall, initial encounter: Secondary | ICD-10-CM

## 2021-07-12 DIAGNOSIS — M25511 Pain in right shoulder: Secondary | ICD-10-CM | POA: Insufficient documentation

## 2021-07-12 DIAGNOSIS — Z87891 Personal history of nicotine dependence: Secondary | ICD-10-CM | POA: Insufficient documentation

## 2021-07-12 DIAGNOSIS — R001 Bradycardia, unspecified: Secondary | ICD-10-CM | POA: Insufficient documentation

## 2021-07-12 DIAGNOSIS — S0990XA Unspecified injury of head, initial encounter: Secondary | ICD-10-CM | POA: Diagnosis not present

## 2021-07-12 DIAGNOSIS — S4991XA Unspecified injury of right shoulder and upper arm, initial encounter: Secondary | ICD-10-CM | POA: Insufficient documentation

## 2021-07-12 DIAGNOSIS — Y990 Civilian activity done for income or pay: Secondary | ICD-10-CM | POA: Diagnosis not present

## 2021-07-12 DIAGNOSIS — W01198A Fall on same level from slipping, tripping and stumbling with subsequent striking against other object, initial encounter: Secondary | ICD-10-CM | POA: Diagnosis not present

## 2021-07-12 MED ORDER — NAPROXEN 375 MG PO TABS: 375.0000 mg | ORAL_TABLET | Freq: Two times a day (BID) | ORAL | 0 refills | Status: AC | PRN

## 2021-07-12 MED ORDER — KETOROLAC TROMETHAMINE 15 MG/ML IJ SOLN
15.0000 mg | Freq: Once | INTRAMUSCULAR | Status: AC
  Administered 2021-07-12: 15 mg via INTRAVENOUS
  Filled 2021-07-12: qty 1

## 2021-07-12 NOTE — ED Triage Notes (Addendum)
Patient BIB [facility] from [employer], where the patient works, patient tripped over the map, and did a weird turn that made her right shoulder hurt. Patient said she hit her head in the back.    EMS gave 222mg fentanyl  '4mg'$  Zofran 18G Left AC 5071mfluid  118/77 BP

## 2021-07-12 NOTE — Discharge Instructions (Addendum)
Please read and follow all provided instructions.  You have been seen today for a fall with an injury to your right shoulder.   Tests performed today include: An x-ray of the affected area - does NOT show any broken bones or dislocations.  Vital signs. See below for your results today.   Home care instructions: -- *PRICE in the first 24-48 hours after injury: Protect (with brace, splint, sling), if given by your provider--- please be sure to move your shoulder as much as possible in the sling to avoid frozen shoulder syndrome.  Rest Ice- Do not apply ice pack directly to your skin, place towel or similar between your skin and ice/ice pack. Apply ice for 20 min, then remove for 40 min while awake Compression- Wear brace, elastic bandage, splint as directed by your provider Elevate affected extremity above the level of your heart when not walking around for the first 24-48 hours   Medications:  - Naproxen is a nonsteroidal anti-inflammatory medication that will help with pain and swelling. Be sure to take this medication as prescribed with food, 1 pill every 12 hours,  It should be taken with food, as it can cause stomach upset, and more seriously, stomach bleeding. Do not take other nonsteroidal anti-inflammatory medications with this such as Advil, Motrin, Aleve, Mobic, Goodie Powder, or Motrin.    You make take Tylenol per over the counter dosing with these medications.   We have prescribed you new medication(s) today. Discuss the medications prescribed today with your pharmacist as they can have adverse effects and interactions with your other medicines including over the counter and prescribed medications. Seek medical evaluation if you start to experience new or abnormal symptoms after taking one of these medicines, seek care immediately if you start to experience difficulty breathing, feeling of your throat closing, facial swelling, or rash as these could be indications of a more serious  allergic reaction   Follow-up instructions: Please follow-up with your primary care provider or the provided orthopedic physician (bone specialist).   Return instructions:  Please return if your digits or extremity are numb or tingling, appear gray or blue, or you have severe pain (also elevate the extremity and loosen splint or wrap if you were given one) Please return if you have redness or fevers.  Please return for new or worsening pain in other areas.  Please return to the Emergency Department if you experience worsening symptoms.  Please return if you have any other emergent concerns. Additional Information:  Your vital signs today were: BP 121/68 (BP Location: Left Arm)   Pulse (!) 50   Temp 98 F (36.7 C) (Oral)   Resp 15   Ht 5' (1.524 m)   Wt 84.5 kg   SpO2 96%   BMI 36.38 kg/m  If your blood pressure (BP) was elevated above 135/85 this visit, please have this repeated by your doctor within one month. ---------------

## 2021-07-12 NOTE — ED Provider Notes (Signed)
[facility] DEPT Provider Note   CSN: [id number] Arrival date & time: 07/12/21  0143     History Chief Complaint  Patient presents with   [name] is a 55 y.o. female with a hx of multiple sclerosis, hypercholesterolemia, and anxiety who presents to the ED via EMS S/p fall that occurred shortly PTA with complaints of right shoulder pain. Patient reports she tripped over a mat at work and fell backwards injuring her shoulder. She did hit the back of her head but states her hair cushioned it and she had no LOC. Pain to right shoulder is constant, worse with movement, alleviated some by fentanyl given by EMS. She denies headache, neck pain, numbness, weakness, or other areas of injury.   HPI     Past Medical History:  Diagnosis Date   Depression    Heart murmur    as a child   Hypercholesteremia    Low back pain    MS (multiple sclerosis) ([place])    Neuromuscular disorder ([place])    MS dx 04/2020   Numbness and tingling 2017   Feet   PAC (premature atrial contraction) 1998   Panic attacks     Patient Active Problem List   Diagnosis Date Noted   Anxiety 12/16/2017   Balance problem 02/27/2016   Paresthesia 02/27/2016    Past Surgical History:  Procedure Laterality Date   ABDOMINAL HYSTERECTOMY  2007   cesarian     1 time   COLONOSCOPY  10/15/2020   [place]   fibriod removal     uterine     OB History   No obstetric history on file.     Family History  Problem Relation Age of Onset   Seizures Mother    Arthritis Mother    Heart disease Maternal Grandfather    Heart disease Maternal Grandmother    Hypertension Sister    COPD Brother    Colon cancer Neg Hx    Colon polyps Neg Hx    Esophageal cancer Neg Hx    Stomach cancer Neg Hx    Rectal cancer Neg Hx     Social History   Tobacco Use   Smoking status: Former   Smokeless tobacco: Never   Tobacco comments:    Quit 10+ years ago.  Vaping Use    Vaping Use: Every day   Substances: CBD  Substance Use Topics   Alcohol use: Yes    Alcohol/week: 0.0 standard drinks    Comment: occasionally   Drug use: No    Home Medications Prior to Admission medications   Medication Sig Start Date End Date Taking? Authorizing Provider  naproxen (NAPROSYN) 375 MG tablet Take 1 tablet (375 mg total) by mouth 2 (two) times daily as needed for moderate pain. 07/12/21  Yes Dora Clauss R, PA-C  aspirin 325 MG tablet Take 325 mg by mouth daily as needed. pain    [provider]  Baclofen 5 MG TABS Take '5mg'$  three times daily as needed for muscle spasms. 11/13/20   [name], DO  furosemide (LASIX) 20 MG tablet Take 20 mg by mouth every morning. 04/15/20   [provider]  ibuprofen (ADVIL,MOTRIN) 600 MG tablet Take 1 tablet (600 mg total) by mouth every 6 (six) hours as needed. Patient not taking: No sig reported 03/13/17   Charlesetta Shanks, MD  ocrelizumab 600 mg in sodium chloride 0.9 % 500 mL Inject 600 mg into the  vein once.    [provider]    Allergies    Shellfish-derived products  Review of Systems   Review of Systems  Constitutional:  Negative for chills and fever.  Respiratory:  Negative for shortness of breath.   Cardiovascular:  Negative for chest pain.  Gastrointestinal:  Negative for abdominal pain.  Musculoskeletal:  Positive for arthralgias. Negative for neck pain.  Neurological:  Negative for syncope, weakness, numbness and headaches.  All other systems reviewed and are negative.  Physical Exam Updated Vital Signs BP (!) 121/57   Pulse (!) 56   Temp 98 F (36.7 C)   Resp 18   Ht 5' (1.524 m)   Wt 84.5 kg   SpO2 97%   BMI 36.38 kg/m   Physical Exam Vitals and nursing note reviewed.  Constitutional:      General: She is not in acute distress.    Appearance: She is well-developed. She is not toxic-appearing.  HENT:     Head: Normocephalic and atraumatic.     Comments: No racoon  eyes or battle sign.  Eyes:     General:        Right eye: No discharge.        Left eye: No discharge.     Conjunctiva/sclera: Conjunctivae normal.     Pupils: Pupils are equal, round, and reactive to light.  Cardiovascular:     Rate and Rhythm: Regular rhythm. Bradycardia present.     Comments: 2+ symmetric radial pulses.  Pulmonary:     Effort: Pulmonary effort is normal. No respiratory distress.     Breath sounds: Normal breath sounds. No wheezing, rhonchi or rales.     Comments: Tender over the right clavicle, otherwise no chest wall tenderness palpation.  There is no overlying skin tenting, ecchymosis, or abrasions. Abdominal:     General: There is no distension.     Palpations: Abdomen is soft.     Tenderness: There is no abdominal tenderness. There is no guarding or rebound.  Musculoskeletal:     Cervical back: Normal range of motion and neck supple. No tenderness.     Comments: No obvious deformities or significant open wounds.  Upper extremities: intact AROM throughout with the exception of R shoulder limited- able to move some actively but fairly minimally secondary to pain per patient. Tender to the diffuse right glenohumeral joint. R clavicular tenderness. Otherwise no significant tenderness, compartments are soft.  Back: No midline tenderness. Lower extremities: Able to actively range at all major joints. No focal areas of tenderness.   Skin:    General: Skin is warm and dry.     Findings: No rash.  Neurological:     Mental Status: She is alert.     Comments: Clear speech. Sensation grossly intact bilateral upper and lower extremities.  5-5 symmetric grip strength.  Patient able to perform okay sign, thumbs up, cross second and third digits bilaterally.  Psychiatric:        Behavior: Behavior normal.    ED Results / Procedures / Treatments   Labs (all labs ordered are listed, but only abnormal results are displayed) Labs Reviewed - No data to  display  EKG None  Radiology DG Clavicle Right  Result Date: 07/12/2021 CLINICAL DATA:  Status post fall. EXAM: RIGHT CLAVICLE - 2+ VIEWS COMPARISON:  None. FINDINGS: There is no evidence of fracture or other focal bone lesions. Degenerative changes seen within the visualized portion of the right acromioclavicular joint and right glenohumeral articulation  soft tissues are unremarkable. IMPRESSION: No acute osseous abnormality. Electronically Signed   By: Virgina Norfolk M.D.   On: 07/12/2021 03:03   DG Shoulder Right  Result Date: 07/12/2021 CLINICAL DATA:  Status post fall. EXAM: RIGHT SHOULDER - 2+ VIEW COMPARISON:  None. FINDINGS: There is no evidence of fracture or dislocation. Moderate severity degenerative changes seen involving the right acromioclavicular joint and right glenohumeral articulation. Soft tissues are unremarkable. IMPRESSION: 1. No acute fracture or dislocation. 2. Moderate severity degenerative changes. Electronically Signed   By: Virgina Norfolk M.D.   On: 07/12/2021 03:04    Procedures Procedures   Medications Ordered in ED Medications  ketorolac (TORADOL) 15 MG/ML injection 15 mg (15 mg Intravenous Given 07/12/21 0431)    ED Course  I have reviewed the triage vital signs and the nursing notes.  Pertinent labs & imaging results that were available during my care of the patient were reviewed by me and considered in my medical decision making (see chart for details).    MDM Rules/Calculators/A&P                           Patient presents to the ED with complaints of right shoulder pain S/p fall. Nontoxic, vitals with mild bradycardia.   Additional history obtained:  Additional history obtained from chart review & nursing note review.   Imaging Studies ordered:  I ordered imaging studies which included right shoulder & right clavicle x-rays, I independently reviewed, formal radiology impression shows:  Right shoulder: 1. No acute fracture or dislocation.  2. Moderate severity degenerative changes Right clavicle:  No acute osseous abnormality.   ED Course:  No signs of serious head/neck/back injury. Per [facility] head ct/cspine rules do not feel CT imaging is necessary at this time, no midline spinal tenderness or focal neurodeficits. Isolated right clavicular tenderness- xray negative, no additional chest wall tenderness, no abdominal tenderness. R shoulder xrays negative for fx/dislocation, Nvi distally. Will provide sling and NSAIDs. Discussed precautions of adhesive capsulitis with sling. Orthopedics follow up. Patient ambulatory in the ED, appears appropriate for discharge home. I discussed results, treatment plan, need for follow-up, and return precautions with the patient. Provided opportunity for questions, patient confirmed understanding and is in agreement with plan.   Portions of this note were generated with [employer]. Dictation errors may occur despite best attempts at proofreading.  Final Clinical Impression(s) / ED Diagnoses Final diagnoses:  Fall, initial encounter  Injury of right shoulder, initial encounter    Rx / DC Orders ED Discharge Orders          Ordered    naproxen (NAPROSYN) 375 MG tablet  2 times daily PRN        07/12/21 0415             Amaryllis Dyke, PA-C 07/12/21 0446    Ripley Fraise, MD 07/12/21 0710

## 2021-07-12 NOTE — ED Notes (Signed)
Shoulder immobilizer applied before discharge.

## 2021-07-12 NOTE — ED Notes (Addendum)
Urine sample and culture sent to lab as a save tube

## 2021-07-16 ENCOUNTER — Other Ambulatory Visit: Payer: Self-pay | Admitting: Family Medicine

## 2021-07-16 DIAGNOSIS — Z1231 Encounter for screening mammogram for malignant neoplasm of breast: Secondary | ICD-10-CM

## 2021-07-17 ENCOUNTER — Ambulatory Visit: Payer: Self-pay

## 2021-07-18 ENCOUNTER — Ambulatory Visit: Payer: Self-pay

## 2021-07-18 ENCOUNTER — Other Ambulatory Visit: Payer: Self-pay

## 2021-07-18 ENCOUNTER — Ambulatory Visit (INDEPENDENT_AMBULATORY_CARE_PROVIDER_SITE_OTHER): Admitting: Orthopaedic Surgery

## 2021-07-18 ENCOUNTER — Encounter: Payer: Self-pay | Admitting: Orthopaedic Surgery

## 2021-07-18 DIAGNOSIS — M25511 Pain in right shoulder: Secondary | ICD-10-CM

## 2021-07-18 DIAGNOSIS — G8929 Other chronic pain: Secondary | ICD-10-CM | POA: Diagnosis not present

## 2021-07-18 MED ORDER — PREDNISONE 10 MG (21) PO TBPK: ORAL_TABLET | ORAL | 0 refills | Status: DC

## 2021-07-18 NOTE — Progress Notes (Signed)
Office Visit Note   Patient: [name]           Date of Birth: 08-12-1966           MRN: [id number] Visit Date: 07/18/2021              Requested by: Leonides Sake, MD [place],  [place] [id number] PCP: Leonides Sake, MD   Assessment & Plan: Visit Diagnoses:  1. Chronic right shoulder pain     Plan: Impression is right shoulder contusion.  We have discussed starting the patient on a steroid taper and may then restart her NSAIDs.  We will also keep her out of work for another 2 weeks as she has a fairly physical job and notes that there is not a light duty or desk work option.  She will follow-up with [place] in 2 weeks time for repeat evaluation.  Call with concerns or questions.  Follow-Up Instructions: Return in about 2 weeks (around 08/01/2021).   Orders:  Orders Placed This Encounter  Procedures   XR Shoulder Right    No orders of the defined types were placed in this encounter.     Procedures: No procedures performed   Clinical Data: No additional findings.   Subjective: Chief Complaint  Patient presents with   Right Shoulder - Pain    DOI 07/12/2021    HPI patient is a pleasant 55 year old female who comes in today following an injury to the right shoulder which occurred at work on 07/12/2021.  This is being filed under [employer].  She notes that she fell backwards landing on her right shoulder onto heavy piece of metal machinery.  She was seen in the ED where x-rays of the shoulder were obtained.  These were negative for acute findings.  The pain she has is primarily to the top of the shoulder and is worse with shoulder abduction, forward flexion and internal rotation.  She has been taking ibuprofen which has seemed to help.  Review of Systems as detailed in HPI.  All others reviewed and are negative.   Objective: Vital Signs: There were no vitals taken for this visit.  Physical Exam well-developed well-nourished female no  acute distress.  Alert and oriented x3.  Ortho Exam right shoulder exam reveals 75% range of motion all planes.  She does have a positive empty can test.  Mild tenderness to the [facility] joint.  Negative cross body adduction.  Near full strength throughout.  Specialty Comments:  No specialty comments available.  Imaging: XR Shoulder Right  Result Date: 07/18/2021 X-rays demonstrate moderate glenohumeral degenerative changes.  No other acute findings    PMFS History: Patient Active Problem List   Diagnosis Date Noted   Anxiety 12/16/2017   Balance problem 02/27/2016   Paresthesia 02/27/2016   Past Medical History:  Diagnosis Date   Depression    Heart murmur    as a child   Hypercholesteremia    Low back pain    MS (multiple sclerosis) ([place])    Neuromuscular disorder ([place])    MS dx 04/2020   Numbness and tingling 2017   Feet   PAC (premature atrial contraction) 1998   Panic attacks     Family History  Problem Relation Age of Onset   Seizures Mother    Arthritis Mother    Heart disease Maternal Grandfather    Heart disease Maternal Grandmother    Hypertension Sister    COPD Brother  Colon cancer Neg Hx    Colon polyps Neg Hx    Esophageal cancer Neg Hx    Stomach cancer Neg Hx    Rectal cancer Neg Hx     Past Surgical History:  Procedure Laterality Date   ABDOMINAL HYSTERECTOMY  2007   cesarian     1 time   COLONOSCOPY  10/15/2020   [place]   fibriod removal     uterine   Social History   Occupational History   Occupation: Tree surgeon  Tobacco Use   Smoking status: Former   Smokeless tobacco: Never   Tobacco comments:    Quit 10+ years ago.  Vaping Use   Vaping Use: Every day   Substances: CBD  Substance and Sexual Activity   Alcohol use: Yes    Alcohol/week: 0.0 standard drinks    Comment: occasionally   Drug use: No   Sexual activity: Not on file

## 2021-08-01 ENCOUNTER — Encounter: Payer: Self-pay | Admitting: Orthopaedic Surgery

## 2021-08-01 ENCOUNTER — Other Ambulatory Visit: Payer: Self-pay

## 2021-08-01 ENCOUNTER — Ambulatory Visit (INDEPENDENT_AMBULATORY_CARE_PROVIDER_SITE_OTHER): Admitting: Orthopaedic Surgery

## 2021-08-01 DIAGNOSIS — M25511 Pain in right shoulder: Secondary | ICD-10-CM | POA: Diagnosis not present

## 2021-08-01 DIAGNOSIS — G8929 Other chronic pain: Secondary | ICD-10-CM | POA: Diagnosis not present

## 2021-08-01 MED ORDER — CELECOXIB 200 MG PO CAPS: 200.0000 mg | ORAL_CAPSULE | Freq: Two times a day (BID) | ORAL | 3 refills | Status: DC

## 2021-08-01 NOTE — Progress Notes (Signed)
Office Visit Note   Patient: [name]           Date of Birth: [birth year]           MRN: [id number] Visit Date: 08/01/2021              Requested by: Leonides Sake, MD [place],  [place] [id number] PCP: Leonides Sake, MD   Assessment & Plan: Visit Diagnoses:  1. Chronic right shoulder pain     Plan: Impression is right shoulder contusion with significant improvement.  This point we will release her back to work this coming Monday.  I recommended taking Celebrex for another couple weeks with discomfort symptoms.  We will see her back as needed.  Follow-Up Instructions: No follow-ups on file.   Orders:  No orders of the defined types were placed in this encounter.  Meds ordered this encounter  Medications   celecoxib (CELEBREX) 200 MG capsule    Sig: Take 1 capsule (200 mg total) by mouth 2 (two) times daily.    Dispense:  30 capsule    Refill:  3      Procedures: No procedures performed   Clinical Data: No additional findings.   Subjective: Chief Complaint  Patient presents with   Right Shoulder - Pain    HPI  [name] returns today for follow-up of right shoulder contusion that occurred at work.  She completed the prednisone Dosepak and is doing home exercises and she feels considerably better.  She feels like she is ready to return back to work this coming Monday.  The prednisone also improved her MS symptoms.  Review of Systems   Objective: Vital Signs: There were no vitals taken for this visit.  Physical Exam  Ortho Exam  Right shoulder has nearly regained all range of motion.  Strength intact.  Specialty Comments:  No specialty comments available.  Imaging: No results found.   PMFS History: Patient Active Problem List   Diagnosis Date Noted   Anxiety 12/16/2017   Balance problem 02/27/2016   Paresthesia 02/27/2016   Past Medical History:  Diagnosis Date   Depression    Heart murmur    as a child    Hypercholesteremia    Low back pain    MS (multiple sclerosis) ([place])    Neuromuscular disorder ([place])    MS dx 04/2020   Numbness and tingling 2017   Feet   PAC (premature atrial contraction) 1998   Panic attacks     Family History  Problem Relation Age of Onset   Seizures Mother    Arthritis Mother    Heart disease Maternal Grandfather    Heart disease Maternal Grandmother    Hypertension Sister    COPD Brother    Colon cancer Neg Hx    Colon polyps Neg Hx    Esophageal cancer Neg Hx    Stomach cancer Neg Hx    Rectal cancer Neg Hx     Past Surgical History:  Procedure Laterality Date   ABDOMINAL HYSTERECTOMY  2007   cesarian     1 time   COLONOSCOPY  10/15/2020   [place]   fibriod removal     uterine   Social History   Occupational History   Occupation: Tree surgeon  Tobacco Use   Smoking status: Former   Smokeless tobacco: Never   Tobacco comments:    Quit 10+ years ago.  Vaping Use   Vaping Use: Every day  Substances: CBD  Substance and Sexual Activity   Alcohol use: Yes    Alcohol/week: 0.0 standard drinks    Comment: occasionally   Drug use: No   Sexual activity: Not on file

## 2021-08-14 ENCOUNTER — Other Ambulatory Visit: Payer: Self-pay

## 2021-08-14 ENCOUNTER — Ambulatory Visit
Admission: RE | Admit: 2021-08-14 | Discharge: 2021-08-14 | Disposition: A | Discharge status: Home / Self Care | Payer: 59 | Source: Ambulatory Visit | Attending: Family Medicine | Admitting: Family Medicine

## 2021-08-14 DIAGNOSIS — Z1231 Encounter for screening mammogram for malignant neoplasm of breast: Secondary | ICD-10-CM

## 2021-12-02 ENCOUNTER — Other Ambulatory Visit (INDEPENDENT_AMBULATORY_CARE_PROVIDER_SITE_OTHER): Payer: Managed Care, Other (non HMO)

## 2021-12-02 ENCOUNTER — Other Ambulatory Visit: Payer: Self-pay

## 2021-12-02 DIAGNOSIS — G35 Multiple sclerosis: Secondary | ICD-10-CM | POA: Diagnosis not present

## 2021-12-02 LAB — VITAMIN D 25 HYDROXY (VIT D DEFICIENCY, FRACTURES): VITD: 66.45 ng/mL (ref 30.00–100.00)

## 2021-12-02 NOTE — Progress Notes (Signed)
NEUROLOGY FOLLOW UP OFFICE NOTE  [name] [id number]  Assessment/Plan:   Multiple sclerosis   DMT:  De Nurse D3 2000 IU daily For leg cramps, restart baclofen at higher dose of 10mg  three times daily Repeat quantitative immunoglobulin panel in 6 months. Repeat MRI of brain/cervical spine with and without contrast in 6 months Follow up 6 months (after repeat labs and MRI)     Subjective:  [name] is a 56 year old right-handed female who follows up for multiple sclerosis.   UPDATE: Current DMT:  Ocrevus (started in August) Current medications:  D3 2000 IU daily, ibuprofen 600mg  PRN(for leg soreness)    Quantitative immunoglobulin panel in July revealed an IgM level of 18.  I extended the Ocrevus infusions to every 8 months.  She is scheduled for her next infusion later this month which would be 7 months.  Repeat immunoglobulin panel yesterday revealed a lower IgM of 14 (IgA and IgG were normal at 117 and 1,048 respectively).  Vit D level was 66.45.     Vision:  No issues Motor:  Feels weak due to pain in legs with prolonged walking Sensory:  No issues Pain:  Legs still sore. Feels like a muscle spasm. Improved with exercise  Pain over her left eye resolved. Gait:  Unsteady when legs hurt Bowel/Bladder:  No issues Fatigue:  No issues Cognition:  No issues Mood:  Improved on Lexapro.   Exercises.  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      HISTORY:  She began having some balance problems and vision problems when she was in her early 60s.   In 2017, she began experiencing bilateral foot numbness and tingling as well as balance problems.  She saw neurology and had a NCV-EMG on 03/18/2016 which was normal.  Labs demonstrated negative ANA, sed rate 11, elevated CRP 7.4, B12 315, folate 9.2, non-reactive RPR, TSH 2.280, Hgb A1c 5.7 and low vit D 9.7.  She couldn't tolerate gabapentin   In March 2021, she began  having left periorbital eye pain, not with movement, but aggravated when rubbing the eye.  She also notes blurred vision in the left eye.  She saw the eye doctor and was found only to have dry eye.     Since 2020 she has had left sided neck pain that radiates down the left arm with numbness in the fingers, aggravated with certain body movements.  No weakness.    Imaging: 12/02/2020 MRI BRAIN W WO:  stable demyelination (more conspicuous than on 03/31/2020, but previously not performed on 3 tesla magnet).   12/02/2020 MRI C-SPINE W WO:  Stable demyelination of the cervical and upper thoracic cord compared to 03/31/2020.  No abnormal enhancement. 03/31/2020 MRI BRAIN WO:  multiple T2 hyperintense lesions involving the periventricular, subcortical and juxtacortical white matter with involvement of the callososeptal interface and brainstem, no enhancement.   02/25/2020 MRI C-SPINE W WO: multiple hyperintense lesions throughout the cervical and upper thoracic cord.     No family history of MS.  However, her mother has RA.   Past medications:  Gabapentin (caused confusion)   JC Virus antibody was positive with index of 0.4.    PAST MEDICAL HISTORY: Past Medical History:  Diagnosis Date   Depression    Heart murmur    as a child   Hypercholesteremia    Low back pain    MS (multiple sclerosis) ([facility])    Neuromuscular disorder (  [place])    MS dx 04/2020   Numbness and tingling 2017   Feet   PAC (premature atrial contraction) 1998   Panic attacks     MEDICATIONS: Current Outpatient Medications on File Prior to Visit  Medication Sig Dispense Refill   aspirin 325 MG tablet Take 325 mg by mouth daily as needed. pain     Baclofen 5 MG TABS Take 5mg  three times daily as needed for muscle spasms. 90 tablet 5   celecoxib (CELEBREX) 200 MG capsule Take 1 capsule (200 mg total) by mouth 2 (two) times daily. 30 capsule 3   furosemide (LASIX) 20 MG tablet Take 20 mg by mouth every morning.     ibuprofen  (ADVIL,MOTRIN) 600 MG tablet Take 1 tablet (600 mg total) by mouth every 6 (six) hours as needed. 30 tablet 0   naproxen (NAPROSYN) 375 MG tablet Take 1 tablet (375 mg total) by mouth 2 (two) times daily as needed for moderate pain. 10 tablet 0   ocrelizumab 600 mg in sodium chloride 0.9 % 500 mL Inject 600 mg into the vein once.     predniSONE (STERAPRED UNI-PAK 21 TAB) 10 MG (21) TBPK tablet Take as directed.  Do not take any other nsaids while on this medication 21 tablet 0   No current facility-administered medications on file prior to visit.    ALLERGIES: Allergies  Allergen Reactions   Shellfish-Derived Products Hives    FAMILY HISTORY: Family History  Problem Relation Age of Onset   Seizures Mother    Arthritis Mother    Heart disease Maternal Grandfather    Heart disease Maternal Grandmother    Hypertension Sister    COPD Brother    Colon cancer Neg Hx    Colon polyps Neg Hx    Esophageal cancer Neg Hx    Stomach cancer Neg Hx    Rectal cancer Neg Hx       Objective:  Blood pressure (!) 133/59, pulse 70, height 5' (1.524 m), weight 178 lb (80.7 kg), SpO2 98 %. General: No acute distress.  Patient appears well-groomed.   Head:  Normocephalic/atraumatic Eyes:  Fundi examined but not visualized Neck: supple, no paraspinal tenderness, full range of motion Heart:  Regular rate and rhythm Lungs:  Clear to auscultation bilaterally Back: No paraspinal tenderness Neurological Exam: alert and oriented to person, place, and time.  Speech fluent and not dysarthric, language intact.  CN II-XII intact. Bulk and tone normal, muscle strength 5-/5 right hip flexion, otherwise 5/5  Sensation to vibration reduced in right foot, otherwise temperature and vibratory sensation intact.  Deep tendon reflexes 3+ throughout, toes downgoing.  Finger to nose testing intact.  Mildly broad-based gait.  Romberg with sway.   Metta Clines, DO  CC: Daiva Eves, MD

## 2021-12-03 ENCOUNTER — Encounter: Payer: Self-pay | Admitting: Neurology

## 2021-12-03 ENCOUNTER — Ambulatory Visit: Payer: Managed Care, Other (non HMO) | Admitting: Neurology

## 2021-12-03 VITALS — BP 133/59 | HR 70 | Ht 60.0 in | Wt 178.0 lb

## 2021-12-03 DIAGNOSIS — G35 Multiple sclerosis: Secondary | ICD-10-CM | POA: Diagnosis not present

## 2021-12-03 LAB — IGG, IGA, IGM
IgG (Immunoglobin G), Serum: 1048 mg/dL (ref 600–1640)
IgM, Serum: 14 mg/dL — ABNORMAL LOW (ref 50–300)
Immunoglobulin A: 117 mg/dL (ref 47–310)

## 2021-12-03 MED ORDER — BACLOFEN 10 MG PO TABS: ORAL_TABLET | ORAL | 5 refills | Status: DC

## 2021-12-03 NOTE — Progress Notes (Signed)
Telephone call to [facility] per Dr.Jaffe: Please rescheduled patient infusion to 8 months from her last infusion 05/09/21.  Appt rescheduled for 12/31/21 at 8:30 am. Patient advised of appt before she left the visit today.

## 2021-12-03 NOTE — Patient Instructions (Addendum)
Continue Ocrevus - change next infusion date to around March 14 - schedule every 8 months thereafter. New order sent [facility].  Appt at [facility] 12/31/21 at 8:30 am.  Continue D3 2000 IU daily Baclofen 10mg  three times daily as needed for leg cramps Check MRI of brain and cervical spine with and without contrast in 6 months. We have sent a referral to [facility] for your MRI and they will call you directly to schedule your appointment. They are located at [place]. If you need to contact them directly please call [phone].  Check quantitative immunoglobulin panel in 6 months Follow up in 6 months (after repeat labs and MRIs)

## 2022-01-07 ENCOUNTER — Ambulatory Visit
Admission: RE | Admit: 2022-01-07 | Discharge: 2022-01-07 | Disposition: A | Discharge status: Home / Self Care | Payer: Managed Care, Other (non HMO) | Source: Ambulatory Visit | Attending: Neurology | Admitting: Neurology

## 2022-01-07 DIAGNOSIS — G35 Multiple sclerosis: Secondary | ICD-10-CM

## 2022-01-07 MED ORDER — GADOBENATE DIMEGLUMINE 529 MG/ML IV SOLN
15.0000 mL | Freq: Once | INTRAVENOUS | Status: AC | PRN
  Administered 2022-01-07: 15 mL via INTRAVENOUS

## 2022-02-03 ENCOUNTER — Telehealth: Payer: Self-pay | Admitting: Neurology

## 2022-02-03 MED ORDER — BACLOFEN 10 MG PO TABS: ORAL_TABLET | ORAL | 0 refills | Status: DC

## 2022-02-03 NOTE — Telephone Encounter (Signed)
Per patient she is taking '10mg'$  of  baclofen TID as noted at her last visit 11/2021 ?

## 2022-02-03 NOTE — Telephone Encounter (Signed)
Per pt she has enough of her 10 mg to last for 90 days.  ?She wanted to make sure she can use that up first before we send anything in. ? ?Advised pt I will ask Dr.Jaffe, If it will be okay to do 1.5 tabs TID of the '10mg'$  ?

## 2022-02-03 NOTE — Telephone Encounter (Signed)
Script sent for prescription for '10mg'$  baclofen 1.5 tablet three times daily quantity 135  ?

## 2022-02-03 NOTE — Telephone Encounter (Signed)
Patient called and said she is continuing to have leg pain even though she's been taking baclofen as recommended. ? ?She'd like to know what other options Dr. Tomi Likens may recommend? ? ?[facility] [place] ?

## 2022-04-03 ENCOUNTER — Other Ambulatory Visit: Payer: Self-pay | Admitting: Neurology

## 2022-04-08 ENCOUNTER — Telehealth: Payer: Self-pay | Admitting: Neurology

## 2022-04-08 NOTE — Telephone Encounter (Signed)
Patient called to report her muscles in her legs are feeling tight and weak for about the last few weeks. She'd like to move her next appointment up to sooner.   Will this be okay with the doctor?

## 2022-04-11 NOTE — Progress Notes (Unsigned)
NEUROLOGY FOLLOW UP OFFICE NOTE  AVIONA MARTENSON [id number]  Assessment/Plan:   Multiple sclerosis   DMT:  De Nurse D3 2000 IU daily For leg cramps, restart baclofen at higher dose of '10mg'$  three times daily Repeat quantitative immunoglobulin panel in 6 months. Repeat MRI of brain/cervical spine with and without contrast in 6 months Follow up 6 months (after repeat labs and MRI)     Subjective:  Sydnie Sigmund is a 56 year old right-handed female who follows up for multiple sclerosis.   UPDATE: Current DMT:  Ocrevus (started in August) Current medications:  D3 2000 IU daily, ibuprofen '600mg'$  PRN(for leg soreness), baclofen '10mg'$  TID   Due to ongoing leg cramps, restarted baclofen at higher dose of '10mg'$  three times daily.  ***  Vision:  No issues Motor:  Feels weak due to pain in legs with prolonged walking Sensory:  No issues Pain:  Legs still sore. Feels like a muscle spasm. Improved with exercise  Pain over her left eye resolved. Gait:  Unsteady when legs hurt Bowel/Bladder:  No issues Fatigue:  No issues Cognition:  No issues Mood:  Improved on Lexapro.   Exercises.  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       HISTORY:  She began having some balance problems and vision problems when she was in her early 2s.   In 2017, she began experiencing bilateral foot numbness and tingling as well as balance problems.  She saw neurology and had a NCV-EMG on 03/18/2016 which was normal.  Labs demonstrated negative ANA, sed rate 11, elevated CRP 7.4, B12 315, folate 9.2, non-reactive RPR, TSH 2.280, Hgb A1c 5.7 and low vit D 9.7.  She couldn't tolerate gabapentin   In March 2021, she began having left periorbital eye pain, not with movement, but aggravated when rubbing the eye.  She also notes blurred vision in the left eye.  She saw the eye doctor and was found only to have dry eye.     Since 2020 she has had left sided neck pain  that radiates down the left arm with numbness in the fingers, aggravated with certain body movements.  No weakness.     Imaging: 12/02/2020 MRI BRAIN W WO:  stable demyelination (more conspicuous than on 03/31/2020, but previously not performed on 3 tesla magnet).   12/02/2020 MRI C-SPINE W WO:  Stable demyelination of the cervical and upper thoracic cord compared to 03/31/2020.  No abnormal enhancement. 03/31/2020 MRI BRAIN WO:  multiple T2 hyperintense lesions involving the periventricular, subcortical and juxtacortical white matter with involvement of the callososeptal interface and brainstem, no enhancement.   02/25/2020 MRI C-SPINE W WO: multiple hyperintense lesions throughout the cervical and upper thoracic cord.     No family history of MS.  However, her mother has RA.   Past medications:  Gabapentin (caused confusion)   JC Virus antibody was positive with index of 0.4.    PAST MEDICAL HISTORY: Past Medical History:  Diagnosis Date   Depression    Heart murmur    as a child   Hypercholesteremia    Low back pain    MS (multiple sclerosis) ([place])    Neuromuscular disorder ([place])    MS dx 04/2020   Numbness and tingling 2017   Feet   PAC (premature atrial contraction) 1998   Panic attacks     MEDICATIONS: Current Outpatient Medications on File Prior to Visit  Medication Sig Dispense Refill  ALPHA LIPOIC AC-BIOTIN-KERATIN PO Take by mouth.     baclofen (LIORESAL) 10 MG tablet TAKE 1 TABLET BY MOUTH THREE TIMES A DAY AS NEEDED 270 tablet 1   escitalopram (LEXAPRO) 10 MG tablet Take 10 mg by mouth daily.     furosemide (LASIX) 20 MG tablet Take 20 mg by mouth every morning.     ibuprofen (ADVIL,MOTRIN) 600 MG tablet Take 1 tablet (600 mg total) by mouth every 6 (six) hours as needed. 30 tablet 0   naproxen (NAPROSYN) 375 MG tablet Take 1 tablet (375 mg total) by mouth 2 (two) times daily as needed for moderate pain. 10 tablet 0   NATURE MADE GLUCOSAMINE PO Take by mouth.      ocrelizumab 600 mg in sodium chloride 0.9 % 500 mL Inject 600 mg into the vein once.     No current facility-administered medications on file prior to visit.    ALLERGIES: Allergies  Allergen Reactions   Shellfish-Derived Products Hives    FAMILY HISTORY: Family History  Problem Relation Age of Onset   Seizures Mother    Arthritis Mother    Heart disease Maternal Grandfather    Heart disease Maternal Grandmother    Hypertension Sister    COPD Brother    Colon cancer Neg Hx    Colon polyps Neg Hx    Esophageal cancer Neg Hx    Stomach cancer Neg Hx    Rectal cancer Neg Hx       Objective:  *** General: No acute distress.  Patient appears ***-groomed.   Head:  Normocephalic/atraumatic Eyes:  Fundi examined but not visualized Neck: supple, no paraspinal tenderness, full range of motion Heart:  Regular rate and rhythm Lungs:  Clear to auscultation bilaterally Back: No paraspinal tenderness Neurological Exam: alert and oriented to person, place, and time.  Speech fluent and not dysarthric, language intact.  CN II-XII intact. Bulk and tone normal, muscle strength 5-/5 right hip flexion, otherwise 5/5 throughout.  Sensation to light touch intact.  Deep tendon reflexes 3+ throughout, toes downgoing.  Finger to nose testing intact.  Mildly broad-based gait, Romberg negative.   Metta Clines, DO  CC: ***

## 2022-04-14 ENCOUNTER — Ambulatory Visit: Payer: Managed Care, Other (non HMO) | Admitting: Neurology

## 2022-04-14 ENCOUNTER — Encounter: Payer: Self-pay | Admitting: Neurology

## 2022-04-14 VITALS — BP 95/53 | HR 78 | Resp 18 | Ht 60.0 in | Wt 175.0 lb

## 2022-04-14 DIAGNOSIS — G35 Multiple sclerosis: Secondary | ICD-10-CM | POA: Diagnosis not present

## 2022-04-14 MED ORDER — PREGABALIN 75 MG PO CAPS: 75.0000 mg | ORAL_CAPSULE | Freq: Two times a day (BID) | ORAL | 5 refills | Status: DC

## 2022-04-14 NOTE — Patient Instructions (Addendum)
Start pregablin '75mg'$  twice daily.  Continue baclofen '10mg'$  three times daily MRI of brain and cervical and thoracic spine with and without contrast  3.   Follow up 4 months.

## 2022-04-16 ENCOUNTER — Other Ambulatory Visit: Payer: Self-pay | Admitting: Neurology

## 2022-04-28 ENCOUNTER — Ambulatory Visit
Admission: RE | Admit: 2022-04-28 | Discharge: 2022-04-28 | Disposition: A | Discharge status: Home / Self Care | Payer: Managed Care, Other (non HMO) | Source: Ambulatory Visit | Attending: Neurology | Admitting: Neurology

## 2022-04-28 DIAGNOSIS — G35 Multiple sclerosis: Secondary | ICD-10-CM

## 2022-04-28 MED ORDER — GADOBENATE DIMEGLUMINE 529 MG/ML IV SOLN
15.0000 mL | Freq: Once | INTRAVENOUS | Status: AC | PRN
  Administered 2022-04-28: 15 mL via INTRAVENOUS

## 2022-05-05 ENCOUNTER — Other Ambulatory Visit: Payer: Managed Care, Other (non HMO)

## 2022-05-20 ENCOUNTER — Ambulatory Visit
Admission: RE | Admit: 2022-05-20 | Discharge: 2022-05-20 | Disposition: A | Discharge status: Home / Self Care | Payer: Managed Care, Other (non HMO) | Source: Ambulatory Visit | Attending: Neurology | Admitting: Neurology

## 2022-05-20 DIAGNOSIS — G35 Multiple sclerosis: Secondary | ICD-10-CM

## 2022-05-20 MED ORDER — GADOBENATE DIMEGLUMINE 529 MG/ML IV SOLN
17.0000 mL | Freq: Once | INTRAVENOUS | Status: AC | PRN
  Administered 2022-05-20: 17 mL via INTRAVENOUS

## 2022-05-23 NOTE — Progress Notes (Signed)
Patient advised.

## 2022-06-09 ENCOUNTER — Other Ambulatory Visit: Payer: Managed Care, Other (non HMO)

## 2022-06-16 ENCOUNTER — Ambulatory Visit: Payer: Managed Care, Other (non HMO) | Admitting: Neurology

## 2022-07-23 ENCOUNTER — Other Ambulatory Visit: Payer: Self-pay | Admitting: Family Medicine

## 2022-07-23 DIAGNOSIS — Z1231 Encounter for screening mammogram for malignant neoplasm of breast: Secondary | ICD-10-CM

## 2022-08-18 ENCOUNTER — Ambulatory Visit
Admission: RE | Admit: 2022-08-18 | Discharge: 2022-08-18 | Disposition: A | Discharge status: Home / Self Care | Payer: Managed Care, Other (non HMO) | Source: Ambulatory Visit | Attending: Family Medicine | Admitting: Family Medicine

## 2022-08-18 DIAGNOSIS — Z1231 Encounter for screening mammogram for malignant neoplasm of breast: Secondary | ICD-10-CM

## 2022-08-20 NOTE — Progress Notes (Signed)
NEUROLOGY FOLLOW UP OFFICE NOTE  [name] [id number]  Assessment/Plan:   1  Multiple sclerosis 2  Left arm numbness - no compressive lesions on MRI C-spine.  Do not suspect acute MS flare.  Consider CTS   DMT:  Ocrevus every 8 months Check MRI of brain/cervical/thoracic with and without contrast in 6 months. D3 2000 IU daily.  Check vitamin D today Check quantitative quantitative immunoglobulins and vit D level n 6 months. Advised to wear wrist splint on left wrist. Follow up 6 months.     Subjective:  [name] is a 56 year old right-handed female who follows up for multiple sclerosis.   UPDATE: Current DMT:  Ocrevus due to low IgM, prolonged dosing to every 8 months) Current medications:  D3 2000 IU daily, ibuprofen '600mg'$  PRN (for leg soreness), baclofen '10mg'$  TID, Lyrica '75mg'$  BID, Lexapro '10mg'$  daily  08/21/2022 LABS:  IgA 120, IgG 1,118, IgM 16;    Imaging personally reviewed: 04/30/2022 MRI BRAIN W WO:  Unchanged white matter disease consistent with multiple sclerosis.  No evidence of active demyelination. 04/30/2022 MRI C-SPINE W WO:  1. Unchanged spinal cord lesions consistent with multiple sclerosis.  No evidence of active demyelination.  2. Unchanged cervical disc and facet degeneration without compressive stenosis. 05/21/2022 MRI T-SPINE W WO:  1. Patchy multifocal cord signal abnormality throughout the thoracic spinal cord as above, consistent with history of demyelinating disease/multiple sclerosis. No evidence for active demyelination.  2. Underlying mild for age thoracic spondylosis without significant stenosis or neural impingement.  Vision:  No issues Motor:  Feels weak due to pain in legs with prolonged walking Sensory:  Notes tingling and numbness in left arm for the past couple of weeks.  From shoulder down to all fingertips.  No associated pain or weakness.  Notices in when she wakes up in the morning. Pain:  arms and legs ache when she  gets up in morning.   Gait:  Unsteady when legs hurt Bowel/Bladder:  No issues Fatigue:  Sometimes has fatigue Cognition:  No issues Mood:  Improved on Lexapro.   Exercises.  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       HISTORY:  She began having some balance problems and vision problems when she was in her early 16s.   In 2017, she began experiencing bilateral foot numbness and tingling as well as balance problems.  She saw neurology and had a NCV-EMG on 03/18/2016 which was normal.  Labs demonstrated negative ANA, sed rate 11, elevated CRP 7.4, B12 315, folate 9.2, non-reactive RPR, TSH 2.280, Hgb A1c 5.7 and low vit D 9.7.  She couldn't tolerate gabapentin   In March 2021, she began having left periorbital eye pain, not with movement, but aggravated when rubbing the eye.  She also notes blurred vision in the left eye.  She saw the eye doctor and was found only to have dry eye.     Since 2020 she has had left sided neck pain that radiates down the left arm with numbness in the fingers, aggravated with certain body movements.  No weakness.     Imaging: 12/02/2020 MRI BRAIN W WO:  stable demyelination (more conspicuous than on 03/31/2020, but previously not performed on 3 tesla magnet).   12/02/2020 MRI C-SPINE W WO:  Stable demyelination of the cervical and upper thoracic cord compared to 03/31/2020.  No abnormal enhancement. 03/31/2020 MRI BRAIN WO:  multiple T2 hyperintense lesions  involving the periventricular, subcortical and juxtacortical white matter with involvement of the callososeptal interface and brainstem, no enhancement.   02/25/2020 MRI C-SPINE W WO: multiple hyperintense lesions throughout the cervical and upper thoracic cord.     No family history of MS.  However, her mother has RA.   Past medications:  Gabapentin (caused confusion)   JC Virus antibody was positive with index of 0.4.    PAST MEDICAL HISTORY: Past Medical History:   Diagnosis Date   Depression    Heart murmur    as a child   Hypercholesteremia    Low back pain    MS (multiple sclerosis) ([place])    Neuromuscular disorder ([place])    MS dx 04/2020   Numbness and tingling 2017   Feet   PAC (premature atrial contraction) 1998   Panic attacks     MEDICATIONS: Current Outpatient Medications on File Prior to Visit  Medication Sig Dispense Refill   ALPHA LIPOIC AC-BIOTIN-KERATIN PO Take by mouth.     baclofen (LIORESAL) 10 MG tablet TAKE 1 TABLET BY MOUTH THREE TIMES A DAY AS NEEDED 270 tablet 1   escitalopram (LEXAPRO) 10 MG tablet Take 10 mg by mouth daily.     furosemide (LASIX) 20 MG tablet Take 20 mg by mouth every morning.     ibuprofen (ADVIL,MOTRIN) 600 MG tablet Take 1 tablet (600 mg total) by mouth every 6 (six) hours as needed. 30 tablet 0   naproxen (NAPROSYN) 375 MG tablet Take 1 tablet (375 mg total) by mouth 2 (two) times daily as needed for moderate pain. 10 tablet 0   NATURE MADE GLUCOSAMINE PO Take by mouth.     ocrelizumab 600 mg in sodium chloride 0.9 % 500 mL Inject 600 mg into the vein once.     pregabalin (LYRICA) 75 MG capsule Take 1 capsule (75 mg total) by mouth 2 (two) times daily. 60 capsule 5   No current facility-administered medications on file prior to visit.    ALLERGIES: Allergies  Allergen Reactions   Shellfish-Derived Products Hives    FAMILY HISTORY: Family History  Problem Relation Age of Onset   Seizures Mother    Arthritis Mother    Heart disease Maternal Grandfather    Heart disease Maternal Grandmother    Hypertension Sister    COPD Brother    Colon cancer Neg Hx    Colon polyps Neg Hx    Esophageal cancer Neg Hx    Stomach cancer Neg Hx    Rectal cancer Neg Hx       Objective:  Blood pressure (!) 140/80, pulse (!) 55, height '4\' 11"'$  (1.499 m), weight 192 lb (87.1 kg), SpO2 99 %.. General: No acute distress.  Patient appears well-groomed.   Head:  Normocephalic/atraumatic Eyes:  Fundi  examined but not visualized Neck: supple, no paraspinal tenderness, full range of motion Heart:  Regular rate and rhythm Back: No paraspinal tenderness Neurological Exam: alert and oriented to person, place, and time.  Speech fluent and not dysarthric, language intact.  CN II-XII intact. Bulk and tone normal, muscle strength 5-/5 right hip flexion, otherwise 5/5 throughout.  Sensation to pinprick reduced in left foot. Vibratory sensation intact..  Deep tendon reflexes 3+ throughout, Hoffman sign absent, Babinski sign absent.  Finger to nose testing intact.  Mildly broad-based gait, using cane Romberg negative.   Metta Clines, DO  CC: Daiva Eves, MD

## 2022-08-21 ENCOUNTER — Other Ambulatory Visit (INDEPENDENT_AMBULATORY_CARE_PROVIDER_SITE_OTHER): Payer: Managed Care, Other (non HMO)

## 2022-08-21 DIAGNOSIS — G35 Multiple sclerosis: Secondary | ICD-10-CM

## 2022-08-23 LAB — IGG, IGA, IGM
IgG (Immunoglobin G), Serum: 1118 mg/dL (ref 600–1640)
IgM, Serum: 16 mg/dL — ABNORMAL LOW (ref 50–300)
Immunoglobulin A: 120 mg/dL (ref 47–310)

## 2022-08-25 ENCOUNTER — Ambulatory Visit: Payer: Managed Care, Other (non HMO) | Admitting: Neurology

## 2022-08-25 ENCOUNTER — Other Ambulatory Visit (INDEPENDENT_AMBULATORY_CARE_PROVIDER_SITE_OTHER): Payer: Managed Care, Other (non HMO)

## 2022-08-25 ENCOUNTER — Encounter: Payer: Self-pay | Admitting: Neurology

## 2022-08-25 VITALS — BP 140/80 | HR 55 | Ht 59.0 in | Wt 192.0 lb

## 2022-08-25 DIAGNOSIS — G35 Multiple sclerosis: Secondary | ICD-10-CM | POA: Diagnosis not present

## 2022-08-25 DIAGNOSIS — R200 Anesthesia of skin: Secondary | ICD-10-CM

## 2022-08-25 LAB — VITAMIN D 25 HYDROXY (VIT D DEFICIENCY, FRACTURES): VITD: 69.46 ng/mL (ref 30.00–100.00)

## 2022-08-25 NOTE — Patient Instructions (Addendum)
Continue Ocrevus every 8 months Check vitamin D level today Repeat immunoglobulin panel and vitamin D in 6 months Repeat MRI of brain, cervical and thoracic spine with and without contrast in 6 months Follow up in 6 months after repeat tests. Wear wrist splint on left wrist

## 2022-09-09 NOTE — Progress Notes (Signed)
[facility] update form received, Ocrevus 600 mg infusion given on 09/02/22. Patient tolerated well.   Next Infusion date 05/04/23 at 8:30 am

## 2022-09-30 IMAGING — MR MR CERVICAL SPINE WO/W CM
5 of 8 series · 28 of 48 positions shown · IV contrast (17 ml multihance)
Comparison: 02/25/2020

CLINICAL DATA: Multiple sclerosis. Weakness and numbness of the
legs.

EXAM:
MRI CERVICAL SPINE WITHOUT AND WITH CONTRAST
TECHNIQUE: Multiplanar and multiecho pulse sequences of the cervical spine, to
include the craniocervical junction and cervicothoracic junction,
were obtained without and with intravenous contrast.
CONTRAST:  17mL MULTIHANCE GADOBENATE DIMEGLUMINE 529 MG/ML IV SOLN

[Series 5: T1 · sagittal · 3.0mm · 0.66mm/px · 4 of 13 slices shown (1 of 3)]
[im 1/13]
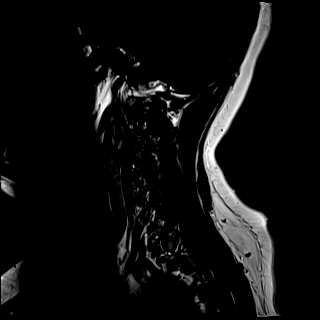
[im 5/13]
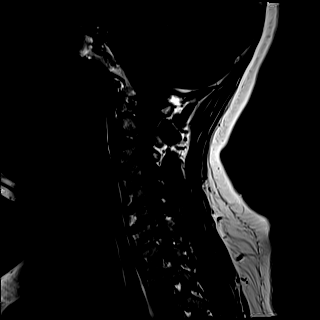
[im 9/13]
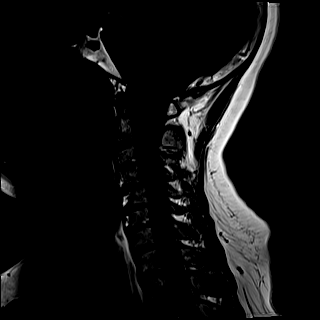
[im 13/13]
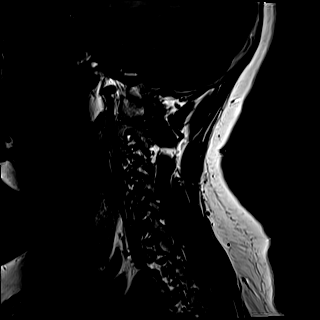

[Series 7: T2 · axial · 3.0mm · 0.50mm/px · z∈[-183,-81]mm · 8 of 28 slices shown (1 of 2)]
[im 1/28]
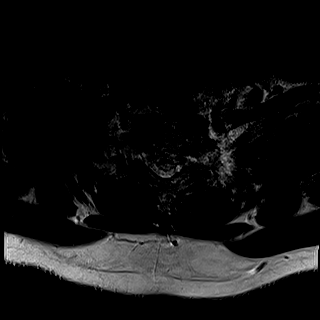
[im 4/28]
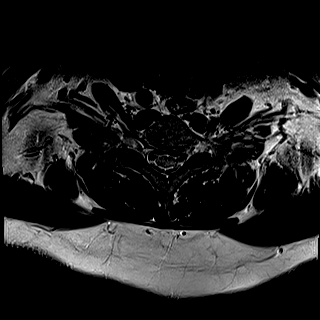
[im 8/28]
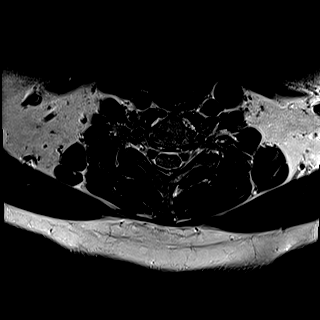
[im 12/28]
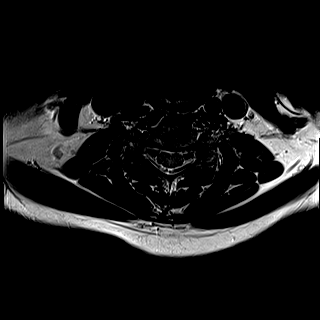
[im 16/28]
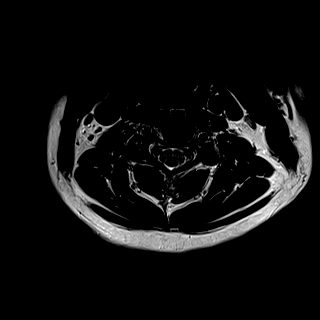
[im 20/28]
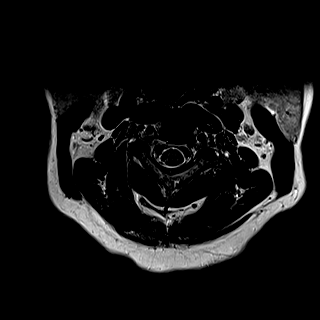
[im 24/28]
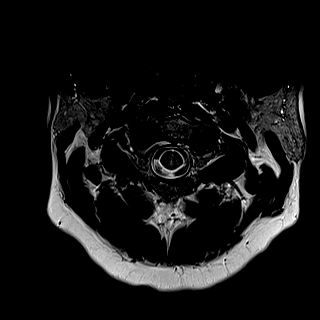
[im 28/28]
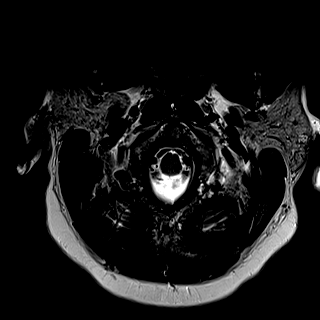

[Series 9: T1 · axial · non-contrast · 3.0mm · 0.31mm/px · z∈[-183,-81]mm · 8 of 28 slices shown (2 of 3)]
[im 1/28]
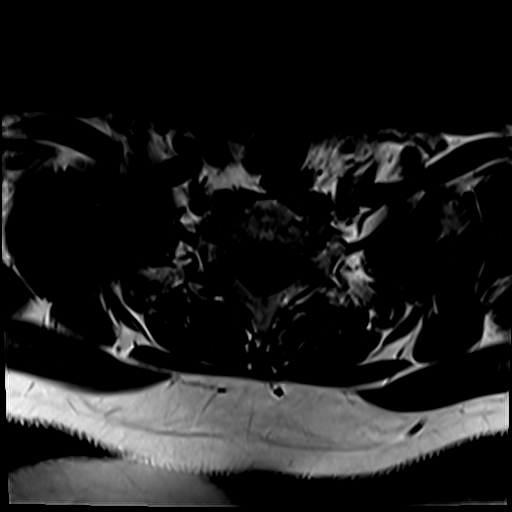
[im 4/28]
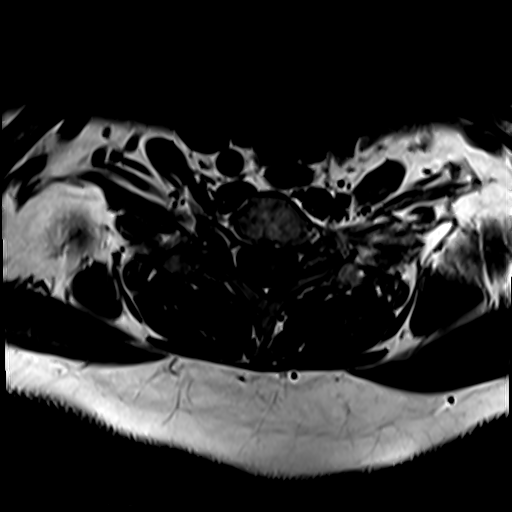
[im 8/28]
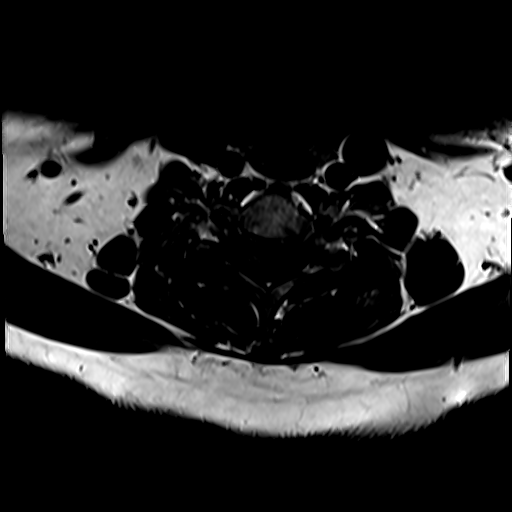
[im 12/28]
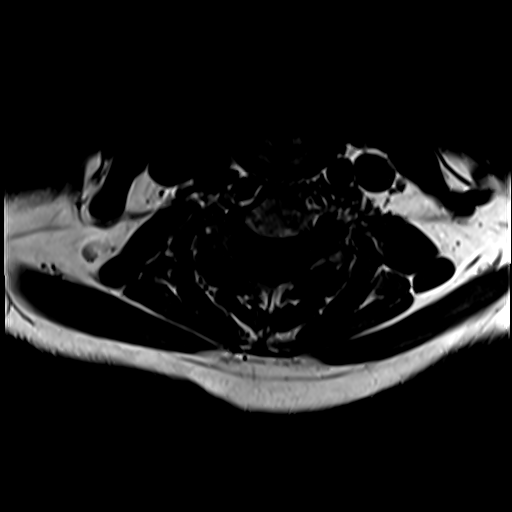
[im 16/28]
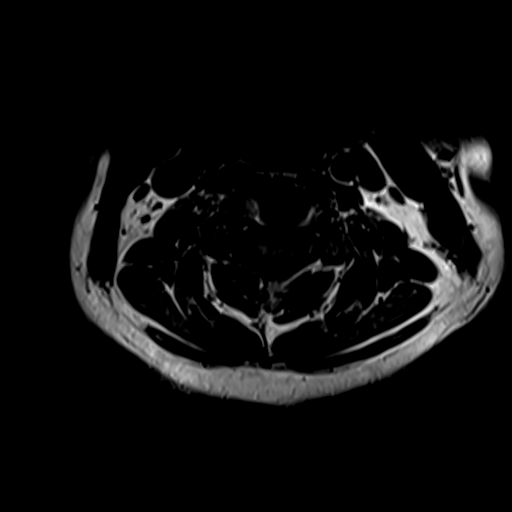
[im 20/28]
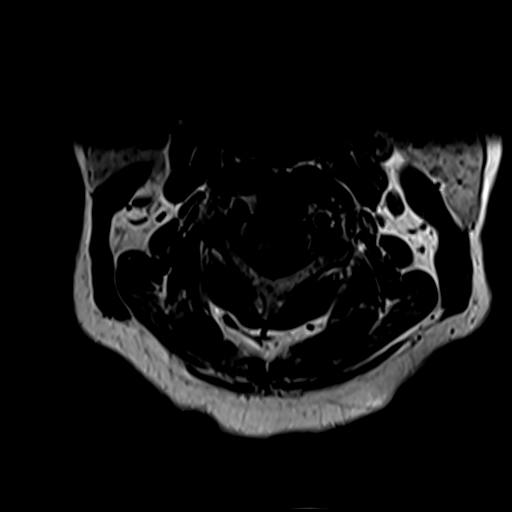
[im 24/28]
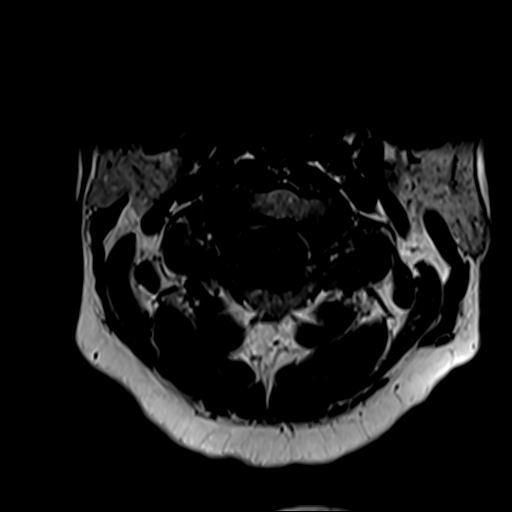
[im 28/28]
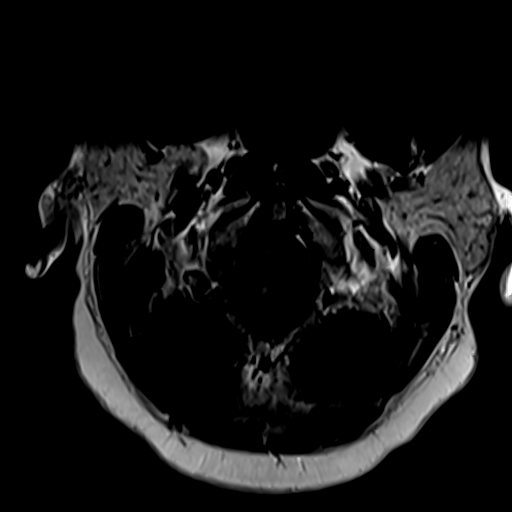

[Series 10: T2 · sagittal · 3.0mm · 0.55mm/px · 4 of 13 slices shown (2 of 2)]
[im 1/13]
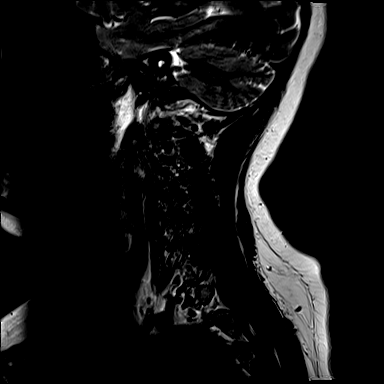
[im 5/13]
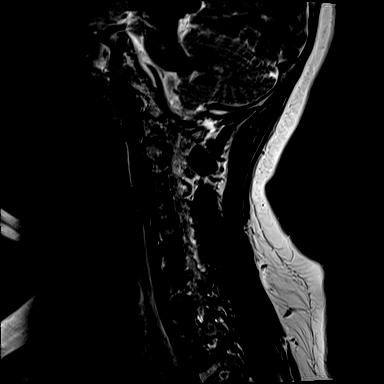
[im 9/13]
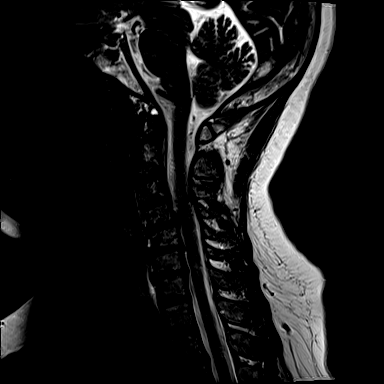
[im 13/13]
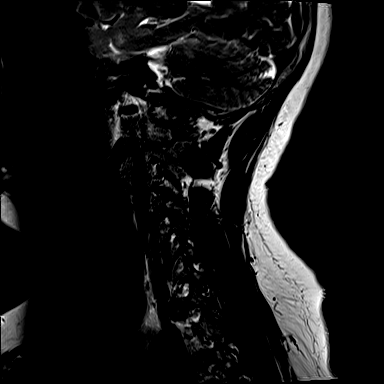

[Series 12: T1 · axial · 3.0mm · 0.31mm/px · z∈[-183,-141]mm · 4 of 28 slices shown (3 of 3)]
[im 1/28]
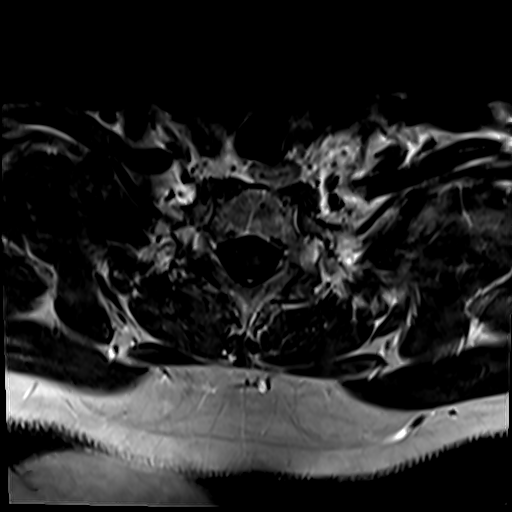
[im 4/28]
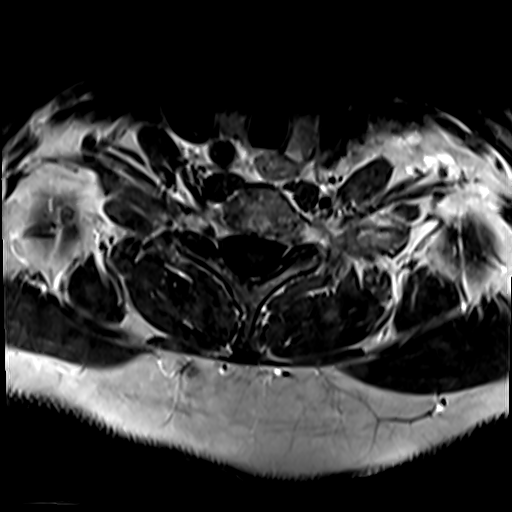
[im 8/28]
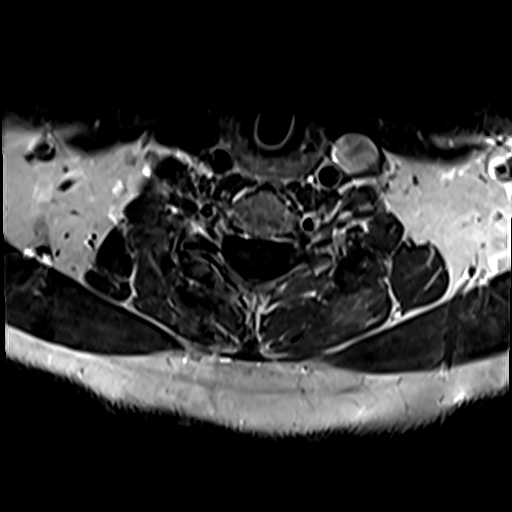
[im 12/28]
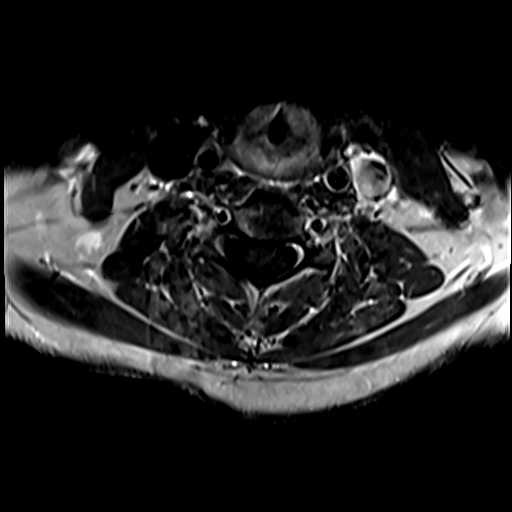

[28 of 48 positions shown; findings below may reference images not displayed]

FINDINGS: Alignment: Straightening the normal cervical lordosis.

Vertebrae: No fracture or primary bone lesion.

Cord: Numerous foci of abnormal T2 signal within the cord noted from
C2 to the level of T3. These appear the same as were seen in [REDACTED]. No new or progressive lesions. No lesions show contrast
enhancement.

Posterior Fossa, vertebral arteries, paraspinal tissues: See results
of brain MRI.

Disc levels:

Ordinary mild non-compressive disc bulges C4-5, C5-6 and C6-7. No
compressive stenosis of the canal or foramina. No significant facet
arthropathy.
IMPRESSION: 1. Multiple foci of abnormal T2 signal within the cervical and upper
thoracic spinal cord consistent with the clinical diagnosis of
multiple sclerosis. No new or progressive lesions or contrast
enhancement. No change since [REDACTED].
2. Ordinary mild non-compressive disc bulges C4-5, C5-6 and C6-7.

## 2022-10-16 ENCOUNTER — Other Ambulatory Visit: Payer: Self-pay | Admitting: Neurology

## 2022-11-19 ENCOUNTER — Other Ambulatory Visit: Payer: Self-pay | Admitting: Neurology

## 2023-01-07 ENCOUNTER — Other Ambulatory Visit (HOSPITAL_COMMUNITY): Payer: Self-pay

## 2023-02-17 ENCOUNTER — Telehealth: Payer: Self-pay | Admitting: Neurology

## 2023-02-17 ENCOUNTER — Other Ambulatory Visit: Payer: Managed Care, Other (non HMO)

## 2023-02-17 NOTE — Telephone Encounter (Signed)
Pt called in stating she was supposed to have an MRI today, but they didn't have authorization. She has new insurance. I have put the [facility] info into the system. She stated she was going to upload the copy of the card through her mychart.

## 2023-02-17 NOTE — Telephone Encounter (Signed)
Per Vashti Hey at [facility]:  The order good, I just removed some notes so the auth specialist can do the auth under [facility] rather than [employer]. Thank you for the udpate!

## 2023-02-17 NOTE — Telephone Encounter (Signed)
Sent a message to Taron at [facility] in regards to below. [employer] has been uploaded to chart by patient.

## 2023-02-20 ENCOUNTER — Other Ambulatory Visit: Payer: Self-pay

## 2023-02-20 ENCOUNTER — Other Ambulatory Visit: Payer: 59

## 2023-02-20 DIAGNOSIS — G35 Multiple sclerosis: Secondary | ICD-10-CM

## 2023-02-23 ENCOUNTER — Ambulatory Visit
Admission: RE | Admit: 2023-02-23 | Discharge: 2023-02-23 | Disposition: A | Discharge status: Home / Self Care | Payer: 59 | Source: Ambulatory Visit | Attending: Neurology | Admitting: Neurology

## 2023-02-23 DIAGNOSIS — G35 Multiple sclerosis: Secondary | ICD-10-CM

## 2023-02-23 LAB — IGG, IGA, IGM
IgG (Immunoglobin G), Serum: 1248 mg/dL (ref 600–1640)
IgM, Serum: 16 mg/dL — ABNORMAL LOW (ref 50–300)
Immunoglobulin A: 156 mg/dL (ref 47–310)

## 2023-02-23 MED ORDER — GADOPICLENOL 0.5 MMOL/ML IV SOLN
9.0000 mL | Freq: Once | INTRAVENOUS | Status: AC | PRN
  Administered 2023-02-23: 9 mL via INTRAVENOUS

## 2023-02-23 NOTE — Progress Notes (Unsigned)
NEUROLOGY FOLLOW UP OFFICE NOTE  [name] [id number]  Assessment/Plan:   Multiple sclerosis Hypertension    DMT:  Emogene Morgan every 8 months.  IgM level has remained in mid-teens despite extending infusions to every 8 months.  So far, doing well.  Continue to monitor. Will get MRI of brain and cervical spine with and without contrast. D3 2000 IU daily.  Check vitamin D today Check quantitative quantitative immunoglobulins and vit D level n 6 months.   She will recheck BP and if still elevated, will contact us. Follow up 6 months.     Subjective:  [name] is a 57 year old right-handed female who follows up for multiple sclerosis.   UPDATE: Current DMT:  Ocrevus due to low IgM, prolonged dosing to every 8 months) Current medications:  D3 2000 IU daily, ibuprofen 600mg  PRN (for leg soreness), baclofen 15mg  TID, Lyrica 75mg  BID, Lexapro 10mg  daily  02/20/2023 LABS:  IgA 156, IgG 1,248, IgM 16;   She had MRI of thoracic spine yesterday, which appears stable.  However, brain and cervical spine were not approved.      Vision:  No issues Motor:  Feels weak due to pain in legs with prolonged walking Sensory:  Brief tingling once in while Pain:  Right leg mainly gets sore.  Gait:  Unsteady when legs hurt Bowel/Bladder:  No issues Fatigue:  Sometimes has fatigue Cognition:  No issues Mood:  Improved on Lexapro.          HISTORY:  She began having some balance problems and vision problems when she was in her early 65s.   In 2017, she began experiencing bilateral foot numbness and tingling as well as balance problems.  She saw neurology and had a NCV-EMG on 03/18/2016 which was normal.  Labs demonstrated negative ANA, sed rate 11, elevated CRP 7.4, B12 315, folate 9.2, non-reactive RPR, TSH 2.280, Hgb A1c 5.7 and low vit D 9.7.  She couldn't tolerate gabapentin   In March 2021, she began having left periorbital eye pain, not with movement, but aggravated when  rubbing the eye.  She also notes blurred vision in the left eye.  She saw the eye doctor and was found only to have dry eye.     Since 2020 she has had left sided neck pain that radiates down the left arm with numbness in the fingers, aggravated with certain body movements.  No weakness.     Imaging: 04/30/2022 MRI BRAIN W WO:  Unchanged white matter disease consistent with multiple sclerosis.  No evidence of active demyelination. 04/30/2022 MRI C-SPINE W WO:  1. Unchanged spinal cord lesions consistent with multiple sclerosis.  No evidence of active demyelination.  2. Unchanged cervical disc and facet degeneration without compressive stenosis. 05/21/2022 MRI T-SPINE W WO:  1. Patchy multifocal cord signal abnormality throughout the thoracic spinal cord as above, consistent with history of demyelinating disease/multiple sclerosis. No evidence for active demyelination.  2. Underlying mild for age thoracic spondylosis without significant stenosis or neural impingement. 12/02/2020 MRI BRAIN W WO:  stable demyelination (more conspicuous than on 03/31/2020, but previously not performed on 3 tesla magnet).   12/02/2020 MRI C-SPINE W WO:  Stable demyelination of the cervical and upper thoracic cord compared to 03/31/2020.  No abnormal enhancement. 03/31/2020 MRI BRAIN WO:  multiple T2 hyperintense lesions involving the periventricular, subcortical and juxtacortical white matter with involvement of the callososeptal interface and brainstem, no enhancement.   02/25/2020 MRI C-SPINE W WO: multiple  hyperintense lesions throughout the cervical and upper thoracic cord.     No family history of MS.  However, her mother has RA.   Past medications:  Gabapentin (caused confusion)   JC Virus antibody was positive with index of 0.4.    PAST MEDICAL HISTORY: Past Medical History:  Diagnosis Date   Depression    Heart murmur    as a child   Hypercholesteremia    Low back pain    MS (multiple sclerosis) ([facility])     Neuromuscular disorder ([facility])    MS dx 04/2020   Numbness and tingling 2017   Feet   PAC (premature atrial contraction) 1998   Panic attacks     MEDICATIONS: Current Outpatient Medications on File Prior to Visit  Medication Sig Dispense Refill   ALPHA LIPOIC AC-BIOTIN-KERATIN PO Take by mouth.     baclofen (LIORESAL) 10 MG tablet TAKE 1 TABLET BY MOUTH THREE TIMES A DAY AS NEEDED 270 tablet 1   escitalopram (LEXAPRO) 10 MG tablet Take 10 mg by mouth daily.     furosemide (LASIX) 20 MG tablet Take 20 mg by mouth every morning.     ibuprofen (ADVIL,MOTRIN) 600 MG tablet Take 1 tablet (600 mg total) by mouth every 6 (six) hours as needed. 30 tablet 0   naproxen (NAPROSYN) 375 MG tablet Take 1 tablet (375 mg total) by mouth 2 (two) times daily as needed for moderate pain. 10 tablet 0   NATURE MADE GLUCOSAMINE PO Take by mouth.     ocrelizumab 600 mg in sodium chloride 0.9 % 500 mL Inject 600 mg into the vein once.     pregabalin (LYRICA) 75 MG capsule TAKE 1 CAPSULE BY MOUTH TWICE A DAY 60 capsule 5   No current facility-administered medications on file prior to visit.    ALLERGIES: Allergies  Allergen Reactions   Shellfish-Derived Products Hives    FAMILY HISTORY: Family History  Problem Relation Age of Onset   Seizures Mother    Arthritis Mother    Heart disease Maternal Grandfather    Heart disease Maternal Grandmother    Hypertension Sister    COPD Brother    Colon cancer Neg Hx    Colon polyps Neg Hx    Esophageal cancer Neg Hx    Stomach cancer Neg Hx    Rectal cancer Neg Hx       Objective:  Blood pressure (!) 170/80, pulse (!) 58, height 5' (1.524 m), weight 182 lb 12.8 oz (82.9 kg), SpO2 97 %. General: No acute distress.  Patient appears well-groomed.   Head:  Normocephalic/atraumatic Eyes:  Fundi examined but not visualized Neck: supple, no paraspinal tenderness, full range of motion Heart:  Regular rate and rhythm Back: No paraspinal tenderness Neurological  Exam: alert and oriented.  Speech fluent and not dysarthric.  Language intact.  CN II-XII intact.  Bulk and tone normal.  Muscle strength 5-/5 right hip flexion, otherwise 5/5 throughout.  Sensation to pinprick reduced in left foot.  Vibratory sensation intact.  DTR 3+ throughout.  Hoffman sign absent.  Babinski sign absent.  Finger to nose testing intact.  Mildly broad-based gait.  Using cane.  Romberg negative.  Shon Millet, DO  CC: Burnell Blanks, MD

## 2023-02-24 ENCOUNTER — Other Ambulatory Visit: Payer: Self-pay | Admitting: Neurology

## 2023-02-24 ENCOUNTER — Encounter: Payer: Self-pay | Admitting: Neurology

## 2023-02-24 ENCOUNTER — Ambulatory Visit (INDEPENDENT_AMBULATORY_CARE_PROVIDER_SITE_OTHER): Payer: 59 | Admitting: Neurology

## 2023-02-24 VITALS — BP 170/80 | HR 58 | Ht 60.0 in | Wt 182.8 lb

## 2023-02-24 DIAGNOSIS — G35 Multiple sclerosis: Secondary | ICD-10-CM

## 2023-02-24 MED ORDER — BACLOFEN 20 MG PO TABS: 20.0000 mg | ORAL_TABLET | Freq: Three times a day (TID) | ORAL | 5 refills | Status: DC

## 2023-02-24 NOTE — Patient Instructions (Signed)
Will let you know if there are any changes on MRI and will try to get the other 2 MRIs approved Check immunoglobulin panel and vit D level in 6 months Ocrevus every 8 months D3 2000 IU daily Increase baclofen to 20mg  three times daily as needed Lyrica Follow up 6 months.

## 2023-03-26 ENCOUNTER — Encounter: Payer: Self-pay | Admitting: Neurology

## 2023-04-06 ENCOUNTER — Ambulatory Visit
Admission: RE | Admit: 2023-04-06 | Discharge: 2023-04-06 | Disposition: A | Discharge status: Home / Self Care | Payer: 59 | Source: Ambulatory Visit | Attending: Neurology | Admitting: Neurology

## 2023-04-06 DIAGNOSIS — G35 Multiple sclerosis: Secondary | ICD-10-CM

## 2023-04-06 MED ORDER — GADOPICLENOL 0.5 MMOL/ML IV SOLN
7.5000 mL | Freq: Once | INTRAVENOUS | Status: AC | PRN
  Administered 2023-04-06: 7.5 mL via INTRAVENOUS

## 2023-04-17 ENCOUNTER — Other Ambulatory Visit: Payer: Self-pay | Admitting: Neurology

## 2023-04-17 NOTE — Progress Notes (Signed)
Patient advised.

## 2023-05-06 ENCOUNTER — Telehealth: Payer: Self-pay

## 2023-05-06 NOTE — Telephone Encounter (Signed)
Fax update from [facility] : Ocrevus 600 mg given without Complications. Next infusion schedule 01/04/24

## 2023-05-31 ENCOUNTER — Other Ambulatory Visit: Payer: Self-pay | Admitting: Neurology

## 2023-07-09 ENCOUNTER — Other Ambulatory Visit: Payer: Self-pay | Admitting: Neurology

## 2023-07-13 ENCOUNTER — Emergency Department (HOSPITAL_COMMUNITY)
Admission: EM | Admit: 2023-07-13 | Discharge: 2023-07-13 | Disposition: A | Discharge status: Home / Self Care | Payer: 59 | Attending: Emergency Medicine | Admitting: Emergency Medicine

## 2023-07-13 ENCOUNTER — Emergency Department (HOSPITAL_COMMUNITY): Payer: 59

## 2023-07-13 DIAGNOSIS — R519 Headache, unspecified: Secondary | ICD-10-CM | POA: Diagnosis not present

## 2023-07-13 DIAGNOSIS — R531 Weakness: Secondary | ICD-10-CM | POA: Diagnosis present

## 2023-07-13 DIAGNOSIS — Z20822 Contact with and (suspected) exposure to covid-19: Secondary | ICD-10-CM | POA: Insufficient documentation

## 2023-07-13 LAB — CBC WITH DIFFERENTIAL/PLATELET
Abs Immature Granulocytes: 0.03 10*3/uL (ref 0.00–0.07)
Basophils Absolute: 0 10*3/uL (ref 0.0–0.1)
Basophils Relative: 0 %
Eosinophils Absolute: 0.1 10*3/uL (ref 0.0–0.5)
Eosinophils Relative: 1 %
HCT: 41.3 % (ref 36.0–46.0)
Hemoglobin: 12.8 g/dL (ref 12.0–15.0)
Immature Granulocytes: 0 %
Lymphocytes Relative: 19 %
Lymphs Abs: 1.3 10*3/uL (ref 0.7–4.0)
MCH: 29.8 pg (ref 26.0–34.0)
MCHC: 31 g/dL (ref 30.0–36.0)
MCV: 96.3 fL (ref 80.0–100.0)
Monocytes Absolute: 0.4 10*3/uL (ref 0.1–1.0)
Monocytes Relative: 6 %
Neutro Abs: 5.2 10*3/uL (ref 1.7–7.7)
Neutrophils Relative %: 74 %
Platelets: 237 10*3/uL (ref 150–400)
RBC: 4.29 MIL/uL (ref 3.87–5.11)
RDW: 13.2 % (ref 11.5–15.5)
WBC: 7.2 10*3/uL (ref 4.0–10.5)
nRBC: 0 % (ref 0.0–0.2)

## 2023-07-13 LAB — COMPREHENSIVE METABOLIC PANEL
ALT: 14 U/L (ref 0–44)
AST: 16 U/L (ref 15–41)
Albumin: 3.8 g/dL (ref 3.5–5.0)
Alkaline Phosphatase: 53 U/L (ref 38–126)
Anion gap: 10 (ref 5–15)
BUN: 11 mg/dL (ref 6–20)
CO2: 22 mmol/L (ref 22–32)
Calcium: 8.9 mg/dL (ref 8.9–10.3)
Chloride: 107 mmol/L (ref 98–111)
Creatinine, Ser: 1.2 mg/dL — ABNORMAL HIGH (ref 0.44–1.00)
GFR, Estimated: 53 mL/min — ABNORMAL LOW (ref >60)
Glucose, Bld: 142 mg/dL — ABNORMAL HIGH (ref 70–99)
Potassium: 4.3 mmol/L (ref 3.5–5.1)
Sodium: 139 mmol/L (ref 135–145)
Total Bilirubin: 0.4 mg/dL (ref 0.3–1.2)
Total Protein: 6.6 g/dL (ref 6.5–8.1)

## 2023-07-13 LAB — SARS CORONAVIRUS 2 BY RT PCR: SARS Coronavirus 2 by RT PCR: NEGATIVE

## 2023-07-13 MED ORDER — GADOBUTROL 1 MMOL/ML IV SOLN
7.5000 mL | Freq: Once | INTRAVENOUS | Status: AC | PRN
  Administered 2023-07-13: 7.5 mL via INTRAVENOUS

## 2023-07-13 NOTE — ED Provider Triage Note (Signed)
Emergency Medicine Provider Triage Evaluation Note  [name] , a 57 y.o. female  was evaluated in triage.  Pt complains of shortness of breath and headache.  Patient reportedly brought in by EMS with concerns of headache for the last hour or so with associated shortness of breath.  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  States that she does not typically have headaches at baseline.  History of multiple sclerosis and feels that she is increasingly weak and feels that speech is slower than typical.  Denies any unilateral weakness or numbness or facial droop.  Review of Systems  Positive: As above Negative: As above  Physical Exam  BP (!) 112/41 (BP Location: Right Arm)   Pulse (!) 50   Temp 98.2 F (36.8 C) (Oral)   Resp 17   Ht 5' (1.524 m)   Wt 82 kg   SpO2 97%   BMI 35.31 kg/m  Gen:   Awake, no distress  Resp:  Normal effort  MSK:   Moves extremities slowly bilaterally but no unilateral weakness. Other:  No slurred speech or facial droop.  Medical Decision Making  Medically screening exam initiated at 2:09 PM.  Appropriate orders placed.  [name] was informed that the remainder of the evaluation will be completed by another provider, this initial triage assessment does not replace that evaluation, and the importance of remaining in the ED until their evaluation is complete.  Code stroke not activated.   Smitty Knudsen, PA-C 07/13/23 1413

## 2023-07-13 NOTE — ED Notes (Signed)
Pt moved to lobby to wait for room after being seen by PA

## 2023-07-13 NOTE — ED Provider Notes (Signed)
[facility] AT [facility] Provider Note   CSN: [id number] Arrival date & time: 07/13/23  1323     History  Chief Complaint  Patient presents with   Shortness of Breath   Headache    [name] is a 57 y.o. female.   Shortness of Breath Associated symptoms: headaches   Headache  Patient is a 57 year old female with a history of multiple sclerosis presenting for weakness and headache.  She reports that she woke up from sleep approximately an hour prior to ED presentation with headache as well as generalized weakness.  She did report associated shortness of breath with the symptoms.  Since waiting to be seen she does report improvement in her symptoms.  She reports mildly increased weakness in her right leg however does have some of this present at baseline.    Home Medications Prior to Admission medications   Medication Sig Start Date End Date Taking? Authorizing Provider  ALPHA LIPOIC AC-BIOTIN-KERATIN PO Take by mouth.    [provider]  baclofen (LIORESAL) 10 MG tablet TAKE 1 TABLET BY MOUTH THREE TIMES A DAY AS NEEDED 04/17/23   Everlena Cooper, Adam R, DO  baclofen (LIORESAL) 20 MG tablet Take 1 tablet (20 mg total) by mouth 3 (three) times daily. 02/24/23   Everlena Cooper, Adam R, DO  escitalopram (LEXAPRO) 10 MG tablet Take 10 mg by mouth daily. 11/17/21   [provider]  furosemide (LASIX) 20 MG tablet Take 20 mg by mouth every morning. 04/15/20   [provider]  ibuprofen (ADVIL,MOTRIN) 600 MG tablet Take 1 tablet (600 mg total) by mouth every 6 (six) hours as needed. 03/13/17   Arby Barrette, MD  naproxen (NAPROSYN) 375 MG tablet Take 1 tablet (375 mg total) by mouth 2 (two) times daily as needed for moderate pain. 07/12/21   Petrucelli, Samantha R, PA-C  ocrelizumab 600 mg in sodium chloride 0.9 % 500 mL Inject 600 mg into the vein once.    [provider]  pregabalin (LYRICA) 75 MG capsule TAKE 1 CAPSULE BY MOUTH  TWICE A DAY 07/10/23   Drema Dallas, DO      Allergies    Shellfish-derived products    Review of Systems   Review of Systems  Respiratory:  Positive for shortness of breath.   Neurological:  Positive for headaches.    Physical Exam Updated Vital Signs BP (!) 131/53   Pulse (!) 49   Temp 98.2 F (36.8 C) (Oral)   Resp 18   Ht 5' (1.524 m)   Wt 82 kg   SpO2 99%   BMI 35.31 kg/m  Physical Exam Vitals and nursing note reviewed.  Constitutional:      General: She is not in acute distress.    Appearance: She is well-developed.  HENT:     Head: Normocephalic and atraumatic.  Eyes:     Conjunctiva/sclera: Conjunctivae normal.  Cardiovascular:     Rate and Rhythm: Normal rate and regular rhythm.     Heart sounds: No murmur heard. Pulmonary:     Effort: Pulmonary effort is normal. No respiratory distress.     Breath sounds: Normal breath sounds.  Abdominal:     Palpations: Abdomen is soft.     Tenderness: There is no abdominal tenderness.  Musculoskeletal:        General: No swelling.     Cervical back: Neck supple.     Comments: Increased difficulty raising the right leg from the bed  compared to the left.  She is able to ambulate with support.  She is unsteady on her feet but reports that this is her baseline gait.  Skin:    General: Skin is warm and dry.     Capillary Refill: Capillary refill takes less than 2 seconds.  Neurological:     Mental Status: She is alert.     ED Results / Procedures / Treatments   Labs (all labs ordered are listed, but only abnormal results are displayed) Labs Reviewed  COMPREHENSIVE METABOLIC PANEL - Abnormal; Notable for the following components:      Result Value   Glucose, Bld 142 (*)    Creatinine, Ser 1.20 (*)    GFR, Estimated 53 (*)    All other components within normal limits  SARS CORONAVIRUS 2 BY RT PCR  CBC WITH DIFFERENTIAL/PLATELET    EKG EKG Interpretation Date/Time:  Monday July 13 2023 13:52:36  EDT Ventricular Rate:  55 PR Interval:  186 QRS Duration:  74 QT Interval:  434 QTC Calculation: 415 R Axis:   19  Text Interpretation: Sinus bradycardia T wave abnormality, consider anterior ischemia Abnormal ECG When compared with ECG of 27-May-2012 04:18, PREVIOUS ECG IS PRESENT No acute changes Confirmed by Derwood Kaplan ([id number]) on 07/13/2023 6:49:35 PM  Radiology DG Chest 1 View  Result Date: 07/13/2023 CLINICAL DATA:  Shortness of breath. EXAM: CHEST  1 VIEW COMPARISON:  X-ray 05/27/2012 FINDINGS: No consolidation, pneumothorax or effusion. No edema. Normal cardiopericardial silhouette. IMPRESSION: No acute cardiopulmonary disease. Electronically Signed   By: Karen Kays M.D.   On: 07/13/2023 16:00    Procedures Procedures    Medications Ordered in ED Medications  gadobutrol (GADAVIST) 1 MMOL/ML injection 7.5 mL (7.5 mLs Intravenous Contrast Given 07/13/23 2033)    ED Course/ Medical Decision Making/ A&P                                 Medical Decision Making Amount and/or Complexity of Data Reviewed Radiology: ordered.  Risk Prescription drug management.   Patient is a 57 year old female with history of multiple sclerosis presenting for weakness and headache.  On my initial evaluation, she is afebrile, hemodynamically stable, no acute distress.  Reports sudden onset of headache and weakness when she woke up earlier this evening.  On exam, she does have mild weakness of the right lower extremity when compared to the left.  Given her history of multiple sclerosis, concern for possible flare.  Additionally considered CVA given her focal deficit.  Will obtain MRI brain with and without to further evaluate.  Given absence of fever, I have less concern for infectious etiology.  Will obtain basic labs to evaluate for electrolyte or metabolic abnormalities.  CBC without leukocytosis or anemia.  CMP with overall normal electrolytes, no AKI or anion gap.  MRI brain is without  new MS lesions or evidence of acute stroke.  Chest x-ray without acute cardiopulmonary abnormality.  Patient reassessed following the above workup and feels that she is now back at her neurologic baseline.  She does feel comfortable with discharge home.  She does have follow-up with her neurologist.  Recommend that she call her neurologist in the morning to let them know what happened tonight.  Recommend close outpatient follow-up with PCP.  Return precautions given.  Patient discharged that further acute event under my care in the emergency department.        Final  Clinical Impression(s) / ED Diagnoses Final diagnoses:  Weakness    Rx / DC Orders ED Discharge Orders     None         Claretha Cooper, DO 07/13/23 2354    Derwood Kaplan, MD 07/14/23 1919

## 2023-07-13 NOTE — ED Notes (Signed)
MD notified of pt symptoms.

## 2023-07-13 NOTE — Discharge Instructions (Signed)
You were seen today for an episode of headache and weakness.  Your MRI did not show any evidence of worsening of your MS or stroke.  Please make an appointment to follow-up with your neurologist.

## 2023-07-13 NOTE — ED Triage Notes (Signed)
Pt to the ed from home via ems with a CC of of headache x 1 hour with sob. Pt relays her vision started to narrow when she woke up with a headache. Pt has a hx of MS but relay she feels increasingly weak and feel she speech is slower.  Pt denies cp, dizziness, LOC.

## 2023-08-12 ENCOUNTER — Other Ambulatory Visit: Payer: Self-pay | Admitting: Neurology

## 2023-08-17 ENCOUNTER — Other Ambulatory Visit: Payer: 59

## 2023-08-25 NOTE — Progress Notes (Unsigned)
NEUROLOGY FOLLOW UP OFFICE NOTE  [name] [id number]  Assessment/Plan:   Multiple sclerosis Hypertension    DMT:  Emogene Morgan every 8 months.  IgM level has remained in mid-teens despite extending infusions to every 8 months.  So far, doing well.  Continue to monitor. *** Will get MRI of brain and cervical spine with and without contrast. D3 2000 IU daily.  Check vitamin D today Check quantitative quantitative immunoglobulins and vit D level n 6 months.   She will recheck BP and if still elevated, will contact us. Follow up 6 months.     Subjective:  [name] is a 57 year old right-handed female who follows up for multiple sclerosis.  ED note personally reviewed.   UPDATE: Current DMT:  Ocrevus (last infusion 05/06/2023); due to low IgM, prolonged dosing to every 8 months) Current medications:  D3 2000 IU daily, ibuprofen 600mg  PRN (for leg soreness), baclofen 15mg  TID, Lyrica 75mg  BID, Lexapro 10mg  daily  02/20/2023 LABS:  IgA 156, IgG 1,248, IgM 16;   MRI of brain and cervical spine with and without contrast on 04/06/2023 personally reviewed appeared stable compared to prior imaging from July 2023.    On 07/13/2023, she woke up from sleep and had a headache, shortness of breath and generalized weakness, more pronounced in her baseline weak right leg.  Seen in the ED. CBC with diff and CMP revealed no evidence of infection or electrolyte abnormalities.  MRI of brain with and without contrast personally reviewed was stable without evidence of acute demyelination or progression of disease.        Vision:  No issues Motor:  Feels weak due to pain in legs with prolonged walking Sensory:  Brief tingling once in while Pain:  Right leg mainly gets sore.  Gait:  Unsteady when legs hurt Bowel/Bladder:  No issues Fatigue:  Sometimes has fatigue Cognition:  No issues Mood:  Improved on Lexapro.          HISTORY:  She began having some balance problems and vision  problems when she was in her early 13s.   In 2017, she began experiencing bilateral foot numbness and tingling as well as balance problems.  She saw neurology and had a NCV-EMG on 03/18/2016 which was normal.  Labs demonstrated negative ANA, sed rate 11, elevated CRP 7.4, B12 315, folate 9.2, non-reactive RPR, TSH 2.280, Hgb A1c 5.7 and low vit D 9.7.  She couldn't tolerate gabapentin   In March 2021, she began having left periorbital eye pain, not with movement, but aggravated when rubbing the eye.  She also notes blurred vision in the left eye.  She saw the eye doctor and was found only to have dry eye.     Since 2020 she has had left sided neck pain that radiates down the left arm with numbness in the fingers, aggravated with certain body movements.  No weakness.     Imaging: 04/06/2023 MRI BRAIN W WO:  Findings consistent with the clinical diagnosis of multiple sclerosis. No new or enhancing lesion identified. 04/06/2023 MRI C-SPINE:  1. Multiple T2 hyperintense lesions of the cervical and upper thoracic spinal cord, consistent with the clinical diagnosis of multiple sclerosis. Although evaluation is somewhat limited by motion, no progression of the disease is appreciated. 2. Mild degenerative changes of the cervical spine without high-grade spinal canal or neural foraminal stenosis at any level. 02/23/2023 MRI T-SPINE W WO:  1. Stable MRI appearance of the Thoracic spine since 05/20/2022.  2. Moderately advanced chronic thoracic spinal cord demyelination. No areas of progression or active demyelination identified. 3. Upper thoracic disc degeneration without spinal stenosis. 04/30/2022 MRI BRAIN W WO:  Unchanged white matter disease consistent with multiple sclerosis.  No evidence of active demyelination. 04/30/2022 MRI C-SPINE W WO:  1. Unchanged spinal cord lesions consistent with multiple sclerosis.  No evidence of active demyelination.  2. Unchanged cervical disc and facet degeneration without  compressive stenosis. 05/21/2022 MRI T-SPINE W WO:  1. Patchy multifocal cord signal abnormality throughout the thoracic spinal cord as above, consistent with history of demyelinating disease/multiple sclerosis. No evidence for active demyelination.  2. Underlying mild for age thoracic spondylosis without significant stenosis or neural impingement. 12/02/2020 MRI BRAIN W WO:  stable demyelination (more conspicuous than on 03/31/2020, but previously not performed on 3 tesla magnet).   12/02/2020 MRI C-SPINE W WO:  Stable demyelination of the cervical and upper thoracic cord compared to 03/31/2020.  No abnormal enhancement. 03/31/2020 MRI BRAIN WO:  multiple T2 hyperintense lesions involving the periventricular, subcortical and juxtacortical white matter with involvement of the callososeptal interface and brainstem, no enhancement.   02/25/2020 MRI C-SPINE W WO: multiple hyperintense lesions throughout the cervical and upper thoracic cord.     No family history of MS.  However, her mother has RA.   Past medications:  Gabapentin (caused confusion)   JC Virus antibody was positive with index of 0.4.    PAST MEDICAL HISTORY: Past Medical History:  Diagnosis Date   Depression    Heart murmur    as a child   Hypercholesteremia    Low back pain    MS (multiple sclerosis) (HCC)    Neuromuscular disorder (HCC)    MS dx 04/2020   Numbness and tingling 2017   Feet   PAC (premature atrial contraction) 1998   Panic attacks     MEDICATIONS: Current Outpatient Medications on File Prior to Visit  Medication Sig Dispense Refill   ALPHA LIPOIC AC-BIOTIN-KERATIN PO Take by mouth.     baclofen (LIORESAL) 10 MG tablet TAKE 1 TABLET BY MOUTH THREE TIMES A DAY AS NEEDED 270 tablet 1   baclofen (LIORESAL) 20 MG tablet Take 1 tablet (20 mg total) by mouth 3 (three) times daily. 90 each 5   escitalopram (LEXAPRO) 10 MG tablet Take 10 mg by mouth daily.     furosemide (LASIX) 20 MG tablet Take 20 mg by mouth  every morning.     ibuprofen (ADVIL,MOTRIN) 600 MG tablet Take 1 tablet (600 mg total) by mouth every 6 (six) hours as needed. 30 tablet 0   naproxen (NAPROSYN) 375 MG tablet Take 1 tablet (375 mg total) by mouth 2 (two) times daily as needed for moderate pain. 10 tablet 0   ocrelizumab 600 mg in sodium chloride 0.9 % 500 mL Inject 600 mg into the vein once.     pregabalin (LYRICA) 75 MG capsule TAKE 1 CAPSULE BY MOUTH TWICE A DAY 60 capsule 0   No current facility-administered medications on file prior to visit.    ALLERGIES: Allergies  Allergen Reactions   Shellfish-Derived Products Hives    FAMILY HISTORY: Family History  Problem Relation Age of Onset   Seizures Mother    Arthritis Mother    Heart disease Maternal Grandfather    Heart disease Maternal Grandmother    Hypertension Sister    COPD Brother    Colon cancer Neg Hx    Colon polyps Neg Hx  Esophageal cancer Neg Hx    Stomach cancer Neg Hx    Rectal cancer Neg Hx       Objective:  *** General: No acute distress.  Patient appears well-groomed.   Head:  Normocephalic/atraumatic Eyes:  Fundi examined but not visualized Neck: supple, no paraspinal tenderness, full range of motion Heart:  Regular rate and rhythm Neurological Exam: alert and oriented.  Speech fluent and not dysarthric.  Language intact.  CN II-XII intact.  Bulk and tone normal.  Muscle strength 5-/5 right hip flexion, otherwise 5/5 throughout.  Sensation to pinprick reduced in left foot.  Vibratory sensation intact.  DTR 3+ throughout.  Hoffman sign absent.  Babinski sign absent.  Finger to nose testing intact.  Mildly broad-based gait.  Using cane.  Romberg negative. ***  Shon Millet, DO  CC: Burnell Blanks, MD

## 2023-08-26 ENCOUNTER — Other Ambulatory Visit (INDEPENDENT_AMBULATORY_CARE_PROVIDER_SITE_OTHER): Payer: 59

## 2023-08-26 ENCOUNTER — Ambulatory Visit (INDEPENDENT_AMBULATORY_CARE_PROVIDER_SITE_OTHER): Payer: 59 | Admitting: Neurology

## 2023-08-26 ENCOUNTER — Encounter: Payer: Self-pay | Admitting: Neurology

## 2023-08-26 VITALS — BP 118/58 | HR 58 | Ht 60.0 in | Wt 190.8 lb

## 2023-08-26 DIAGNOSIS — G35 Multiple sclerosis: Secondary | ICD-10-CM | POA: Diagnosis not present

## 2023-08-26 LAB — VITAMIN D 25 HYDROXY (VIT D DEFICIENCY, FRACTURES): VITD: 78.34 ng/mL (ref 30.00–100.00)

## 2023-08-26 NOTE — Patient Instructions (Addendum)
Continue Ocrevus Check immunoglobulin panel and vit D Check MRI of cervical and thoracic spine with and without contrast. We have sent a referral to [facility] for your MRI and they will call you directly to schedule your appointment. They are located at [address] [facility]. If you need to contact them directly please call [phone].  Physical therapy for right leg weakness and gait instability. [facility] Address: [address], [place], [place] [address] Hours:  Open ? Closes 5?PM Phone: [phone] Follow up in 6 months, sooner if needed.

## 2023-08-27 LAB — IGG, IGA, IGM
IgG (Immunoglobin G), Serum: 1147 mg/dL (ref 600–1640)
IgM, Serum: 14 mg/dL — ABNORMAL LOW (ref 50–300)
Immunoglobulin A: 147 mg/dL (ref 47–310)

## 2023-08-31 ENCOUNTER — Ambulatory Visit: Payer: 59 | Admitting: Physical Therapy

## 2023-09-07 ENCOUNTER — Ambulatory Visit: Payer: 59 | Attending: Neurology | Admitting: Physical Therapy

## 2023-09-15 ENCOUNTER — Other Ambulatory Visit: Payer: Self-pay | Admitting: Neurology

## 2023-09-22 ENCOUNTER — Telehealth: Payer: Self-pay | Admitting: Neurology

## 2023-09-22 DIAGNOSIS — G35 Multiple sclerosis: Secondary | ICD-10-CM

## 2023-09-22 NOTE — Telephone Encounter (Signed)
Pt called in and left a message. She stated she would like a referral to [facility] in [place].

## 2023-09-23 NOTE — Telephone Encounter (Signed)
Referral sent to [facility], for PT

## 2023-10-10 ENCOUNTER — Ambulatory Visit
Admission: RE | Admit: 2023-10-10 | Discharge: 2023-10-10 | Disposition: A | Discharge status: Home / Self Care | Payer: 59 | Source: Ambulatory Visit | Attending: Neurology | Admitting: Neurology

## 2023-10-10 DIAGNOSIS — G35 Multiple sclerosis: Secondary | ICD-10-CM

## 2023-10-10 MED ORDER — GADOPICLENOL 0.5 MMOL/ML IV SOLN
10.0000 mL | Freq: Once | INTRAVENOUS | Status: AC | PRN
  Administered 2023-10-10: 8 mL via INTRAVENOUS

## 2023-10-14 ENCOUNTER — Other Ambulatory Visit: Payer: Self-pay | Admitting: Family Medicine

## 2023-10-14 DIAGNOSIS — Z1231 Encounter for screening mammogram for malignant neoplasm of breast: Secondary | ICD-10-CM

## 2023-10-15 DIAGNOSIS — Z1231 Encounter for screening mammogram for malignant neoplasm of breast: Secondary | ICD-10-CM

## 2023-11-04 DIAGNOSIS — Z1231 Encounter for screening mammogram for malignant neoplasm of breast: Secondary | ICD-10-CM

## 2023-11-09 ENCOUNTER — Other Ambulatory Visit: Payer: Self-pay | Admitting: Neurology

## 2023-11-11 ENCOUNTER — Ambulatory Visit: Payer: 59

## 2023-11-18 ENCOUNTER — Ambulatory Visit
Admission: RE | Admit: 2023-11-18 | Discharge: 2023-11-18 | Disposition: A | Discharge status: Home / Self Care | Payer: 59 | Source: Ambulatory Visit | Attending: Family Medicine | Admitting: Family Medicine

## 2023-11-18 DIAGNOSIS — Z1231 Encounter for screening mammogram for malignant neoplasm of breast: Secondary | ICD-10-CM

## 2023-11-19 ENCOUNTER — Encounter: Payer: Self-pay | Admitting: Neurology

## 2024-01-07 ENCOUNTER — Telehealth: Payer: Self-pay

## 2024-01-07 NOTE — Telephone Encounter (Signed)
 [facility] Update  Ocrevus patient tolerated well.    Next appt TBD new order needed.   Patient has a follow up 01/20/24 with Dr.Jaffe.

## 2024-01-19 ENCOUNTER — Other Ambulatory Visit

## 2024-01-19 ENCOUNTER — Other Ambulatory Visit: Payer: Self-pay

## 2024-01-19 DIAGNOSIS — G35 Multiple sclerosis: Secondary | ICD-10-CM

## 2024-01-19 NOTE — Progress Notes (Unsigned)
 NEUROLOGY FOLLOW UP OFFICE NOTE  [name] [id number]  Assessment/Plan:   Multiple sclerosis Tension-type headache, not intractable Excessive daytime sleepiness    DMT:  Ocrevus every 8 months (low IgM) D3 2000 IU daily.   Baclofen, Lyrica For evaluation of fatigue, check B12, TSH, free T4.  If fatigue doesn't improve with new work schedule, consider referral for sleep study. Check quantitative quantitative immunoglobulins, CBC with diff, LFTs and vit D level today and in 6 months.   Follow up 6 months.     Subjective:  [name] is a 58 year old right-handed female who follows up for multiple sclerosis.  MRI of cervical and thoracic spine personally reviewed.   UPDATE: Current DMT:  Ocrevus (last infusion 05/06/2023); due to low IgM, prolonged dosing to every 8 months) Current medications:  D3 2000 IU daily, ibuprofen 600mg  PRN (for leg soreness), baclofen 15mg  TID, Lyrica 75mg  BID, Lexapro 10mg  daily  Due to worsening weakness in right leg without explanation on brain MRI, she had MRI of spinal cord: 10/10/2023 MRI C & T-SPINE W WO:  1. No change in the appearance of the cervical and thoracic spine since the prior exams. Patchy foci of cord signal abnormality are again seen in the cervical and thoracic cord consistent with the patient's history of multiple sclerosis. No new or enhancing lesion is identified. 2. Mild cervical and thoracic spondylosis without stenosis.  08/26/2023 LABS:  IgA 147, IgG 1147, IgM 14; vit D 78.34 01/19/2024 LABS:  IgA 120, IgG 1055, IgM 22; vit D 64 Vision:  No issues Motor:  Referred to PT for ongoing weakness.  Helped a lot.   Sensory:  Brief tingling once in while Pain:  Right leg mainly gets sore.  Gait:  Unsteady when legs hurt Bowel/Bladder:  No issues Fatigue:  Reports increased fatigue.  Even when sleeping well.  Her son has told her that she snores.   When she wakes up, her throat feels dry and scratchy.  However, she  was off her routine work schedule for a time but has recently switched back.   Cognition:  No issues Mood:  Improved on Lexapro.   Tension-type headache:  She does have a dull diffuse nonthrobbing headache without associated symptoms that occurs twice a month.          HISTORY:  She began having some balance problems and vision problems when she was in her early 9s.   In 2017, she began experiencing bilateral foot numbness and tingling as well as balance problems.  She saw neurology and had a NCV-EMG on 03/18/2016 which was normal.  Labs demonstrated negative ANA, sed rate 11, elevated CRP 7.4, B12 315, folate 9.2, non-reactive RPR, TSH 2.280, Hgb A1c 5.7 and low vit D 9.7.  She couldn't tolerate gabapentin   In March 2021, she began having left periorbital eye pain, not with movement, but aggravated when rubbing the eye.  She also notes blurred vision in the left eye.  She saw the eye doctor and was found only to have dry eye.     Since 2020 she has had left sided neck pain that radiates down the left arm with numbness in the fingers (particularly 3rd digit), aggravated with certain body movements.  No weakness.     Imaging: 07/13/2023 MRI BRAIN W WO:  1. No acute intracranial process. No evidence of acute or subacute infarct. 2. Redemonstrated findings consistent with the clinical diagnosis of multiple sclerosis. No new or enhancing lesions  identified. 04/06/2023 MRI BRAIN W WO:  Findings consistent with the clinical diagnosis of multiple sclerosis. No new or enhancing lesion identified. 04/06/2023 MRI C-SPINE:  1. Multiple T2 hyperintense lesions of the cervical and upper thoracic spinal cord, consistent with the clinical diagnosis of multiple sclerosis. Although evaluation is somewhat limited by motion, no progression of the disease is appreciated. 2. Mild degenerative changes of the cervical spine without high-grade spinal canal or neural foraminal stenosis at any level. 02/23/2023 MRI  T-SPINE W WO:  1. Stable MRI appearance of the Thoracic spine since 05/20/2022. 2. Moderately advanced chronic thoracic spinal cord demyelination. No areas of progression or active demyelination identified. 3. Upper thoracic disc degeneration without spinal stenosis. 04/30/2022 MRI BRAIN W WO:  Unchanged white matter disease consistent with multiple sclerosis.  No evidence of active demyelination. 04/30/2022 MRI C-SPINE W WO:  1. Unchanged spinal cord lesions consistent with multiple sclerosis.  No evidence of active demyelination.  2. Unchanged cervical disc and facet degeneration without compressive stenosis. 05/21/2022 MRI T-SPINE W WO:  1. Patchy multifocal cord signal abnormality throughout the thoracic spinal cord as above, consistent with history of demyelinating disease/multiple sclerosis. No evidence for active demyelination.  2. Underlying mild for age thoracic spondylosis without significant stenosis or neural impingement. 12/02/2020 MRI BRAIN W WO:  stable demyelination (more conspicuous than on 03/31/2020, but previously not performed on 3 tesla magnet).   12/02/2020 MRI C-SPINE W WO:  Stable demyelination of the cervical and upper thoracic cord compared to 03/31/2020.  No abnormal enhancement. 03/31/2020 MRI BRAIN WO:  multiple T2 hyperintense lesions involving the periventricular, subcortical and juxtacortical white matter with involvement of the callososeptal interface and brainstem, no enhancement.   02/25/2020 MRI C-SPINE W WO: multiple hyperintense lesions throughout the cervical and upper thoracic cord.     No family history of MS.  However, her mother has RA.   Past medications:  Gabapentin (caused confusion)   JC Virus antibody was positive with index of 0.4.    PAST MEDICAL HISTORY: Past Medical History:  Diagnosis Date   Depression    Heart murmur    as a child   Hypercholesteremia    Low back pain    MS (multiple sclerosis) (HCC)    Neuromuscular disorder (HCC)    MS  dx 04/2020   Numbness and tingling 2017   Feet   PAC (premature atrial contraction) 1998   Panic attacks     MEDICATIONS: Current Outpatient Medications on File Prior to Visit  Medication Sig Dispense Refill   ALPHA LIPOIC AC-BIOTIN-KERATIN PO Take by mouth.     baclofen (LIORESAL) 10 MG tablet TAKE 1 TABLET BY MOUTH THREE TIMES A DAY AS NEEDED 90 tablet 3   baclofen (LIORESAL) 20 MG tablet Take 1 tablet (20 mg total) by mouth 3 (three) times daily. 90 each 5   escitalopram (LEXAPRO) 10 MG tablet Take 10 mg by mouth daily.     furosemide (LASIX) 20 MG tablet Take 20 mg by mouth every morning.     ibuprofen (ADVIL,MOTRIN) 600 MG tablet Take 1 tablet (600 mg total) by mouth every 6 (six) hours as needed. 30 tablet 0   naproxen (NAPROSYN) 375 MG tablet Take 1 tablet (375 mg total) by mouth 2 (two) times daily as needed for moderate pain. 10 tablet 0   ocrelizumab 600 mg in sodium chloride 0.9 % 500 mL Inject 600 mg into the vein once.     pregabalin (LYRICA) 75 MG capsule TAKE 1  CAPSULE BY MOUTH TWICE A DAY 60 capsule 5   No current facility-administered medications on file prior to visit.    ALLERGIES: Allergies  Allergen Reactions   Shellfish-Derived Products Hives    FAMILY HISTORY: Family History  Problem Relation Age of Onset   Seizures Mother    Arthritis Mother    Heart disease Maternal Grandfather    Heart disease Maternal Grandmother    Hypertension Sister    COPD Brother    Colon cancer Neg Hx    Colon polyps Neg Hx    Esophageal cancer Neg Hx    Stomach cancer Neg Hx    Rectal cancer Neg Hx       Objective:  Blood pressure (!) 116/55, pulse 76, height 4\' 11"  (1.499 m), weight 191 lb 12.8 oz (87 kg), SpO2 98%. General: No acute distress.  Patient appears well-groomed.   Head:  Normocephalic/atraumatic Eyes:  Fundi examined but not visualized Neck: supple, no paraspinal tenderness, full range of motion Heart:  Regular rate and rhythm Neurological Exam: alert  and oriented.  Speech fluent and not dysarthric.  Language intact.  CN II-XII intact.  Bulk and tone normal.  Muscle strength 4/5 right hip flexion and ankle dorsiflexion, otherwise 5/5 throughout.  Sensation to pinprick reduced in left foot.  Vibratory sensation intact.  DTR 3+ throughout.  Hoffman sign absent.  Babinski sign absent.  Finger to nose testing intact.  Mildly broad-based gait.  Using cane.  Romberg negative.   Shon Millet, DO  CC: Burnell Blanks, MD

## 2024-01-20 ENCOUNTER — Other Ambulatory Visit

## 2024-01-20 ENCOUNTER — Encounter: Payer: Self-pay | Admitting: Neurology

## 2024-01-20 ENCOUNTER — Ambulatory Visit (INDEPENDENT_AMBULATORY_CARE_PROVIDER_SITE_OTHER): Payer: 59 | Admitting: Neurology

## 2024-01-20 VITALS — BP 116/55 | HR 76 | Ht 59.0 in | Wt 191.8 lb

## 2024-01-20 DIAGNOSIS — G4719 Other hypersomnia: Secondary | ICD-10-CM | POA: Diagnosis not present

## 2024-01-20 DIAGNOSIS — G44219 Episodic tension-type headache, not intractable: Secondary | ICD-10-CM

## 2024-01-20 DIAGNOSIS — G35 Multiple sclerosis: Secondary | ICD-10-CM

## 2024-01-20 DIAGNOSIS — G35D Multiple sclerosis, unspecified: Secondary | ICD-10-CM

## 2024-01-20 LAB — TSH+FREE T4: TSH W/REFLEX TO FT4: 1.92 m[IU]/L (ref 0.40–4.50)

## 2024-01-20 LAB — VITAMIN D 25 HYDROXY (VIT D DEFICIENCY, FRACTURES): Vit D, 25-Hydroxy: 64 ng/mL (ref 30–100)

## 2024-01-20 LAB — IGG, IGA, IGM
IgG (Immunoglobin G), Serum: 1055 mg/dL (ref 600–1640)
IgM, Serum: 22 mg/dL — ABNORMAL LOW (ref 50–300)
Immunoglobulin A: 120 mg/dL (ref 47–310)

## 2024-01-20 LAB — VITAMIN B12: Vitamin B-12: 296 pg/mL (ref 200–1100)

## 2024-01-20 NOTE — Patient Instructions (Addendum)
 Continue Ocrevus every 8 months Continue D3 2000 units daily, baclofen, Lyrica Check B12, TSH and free T4 today Check immunoglobulin panel, CBC with diff, LFTs and vit D in 6 months. Follow up 6 months.

## 2024-01-22 NOTE — Progress Notes (Signed)
 LMOVM for patient to call the office back.

## 2024-01-27 ENCOUNTER — Telehealth: Payer: Self-pay

## 2024-01-27 DIAGNOSIS — G35 Multiple sclerosis: Secondary | ICD-10-CM

## 2024-01-27 DIAGNOSIS — E538 Deficiency of other specified B group vitamins: Secondary | ICD-10-CM

## 2024-01-28 NOTE — Telephone Encounter (Signed)
 Patient of her Test results, B12 level is in the low-normal range which may potentially cause fatigue.  Recommend taking over the counter B12 1000 mcg daily.  I would like to add a repeat B12 level to the labs that are to be checked in 6 months.   B12 ordered and paperwork at the front desk.

## 2024-01-28 NOTE — Telephone Encounter (Signed)
 Patient left message with  the after hour service about getting her test results

## 2024-02-24 ENCOUNTER — Ambulatory Visit: Payer: 59 | Admitting: Neurology

## 2024-03-21 ENCOUNTER — Other Ambulatory Visit: Payer: Self-pay | Admitting: Neurology

## 2024-03-23 ENCOUNTER — Other Ambulatory Visit: Payer: Self-pay | Admitting: Neurology

## 2024-04-27 ENCOUNTER — Other Ambulatory Visit: Payer: Self-pay | Admitting: Neurology

## 2024-05-02 ENCOUNTER — Telehealth: Payer: Self-pay | Admitting: Neurology

## 2024-05-02 MED ORDER — BACLOFEN 20 MG PO TABS: 20.0000 mg | ORAL_TABLET | Freq: Three times a day (TID) | ORAL | 5 refills | Status: AC

## 2024-05-02 NOTE — Telephone Encounter (Signed)
 Per Dr.Jaffe, okay to send in refills.

## 2024-05-02 NOTE — Telephone Encounter (Signed)
 Pt is calling to find out why we denied her Baclofen     She uses the [facility] in [place]

## 2024-07-22 ENCOUNTER — Other Ambulatory Visit

## 2024-07-23 LAB — HEPATIC FUNCTION PANEL
AG Ratio: 1.9 (calc) (ref 1.0–2.5)
ALT: 15 U/L (ref 6–29)
AST: 17 U/L (ref 10–35)
Albumin: 4.6 g/dL (ref 3.6–5.1)
Alkaline phosphatase (APISO): 60 U/L (ref 37–153)
Bilirubin, Direct: 0.1 mg/dL (ref 0.0–0.2)
Globulin: 2.4 g/dL (ref 1.9–3.7)
Indirect Bilirubin: 0.4 mg/dL (ref 0.2–1.2)
Total Bilirubin: 0.5 mg/dL (ref 0.2–1.2)
Total Protein: 7 g/dL (ref 6.1–8.1)

## 2024-07-23 LAB — CBC WITH DIFFERENTIAL/PLATELET
Absolute Lymphocytes: 3414 {cells}/uL (ref 850–3900)
Absolute Monocytes: 437 {cells}/uL (ref 200–950)
Basophils Absolute: 39 {cells}/uL (ref 0–200)
Basophils Relative: 0.4 %
Eosinophils Absolute: 301 {cells}/uL (ref 15–500)
Eosinophils Relative: 3.1 %
HCT: 41.3 % (ref 35.0–45.0)
Hemoglobin: 13.8 g/dL (ref 11.7–15.5)
MCH: 30.3 pg (ref 27.0–33.0)
MCHC: 33.4 g/dL (ref 32.0–36.0)
MCV: 90.6 fL (ref 80.0–100.0)
MPV: 10.8 fL (ref 7.5–12.5)
Monocytes Relative: 4.5 %
Neutro Abs: 5510 {cells}/uL (ref 1500–7800)
Neutrophils Relative %: 56.8 %
Platelets: 293 Thousand/uL (ref 140–400)
RBC: 4.56 Million/uL (ref 3.80–5.10)
RDW: 13.1 % (ref 11.0–15.0)
Total Lymphocyte: 35.2 %
WBC: 9.7 Thousand/uL (ref 3.8–10.8)

## 2024-07-23 LAB — IGG, IGA, IGM
IgG (Immunoglobin G), Serum: 1041 mg/dL (ref 600–1640)
IgM, Serum: 11 mg/dL — ABNORMAL LOW (ref 50–300)
Immunoglobulin A: 118 mg/dL (ref 47–310)

## 2024-07-23 LAB — VITAMIN D 25 HYDROXY (VIT D DEFICIENCY, FRACTURES): Vit D, 25-Hydroxy: 61 ng/mL (ref 30–100)

## 2024-07-23 LAB — VITAMIN B12: Vitamin B-12: 641 pg/mL (ref 200–1100)

## 2024-07-26 NOTE — Progress Notes (Unsigned)
 NEUROLOGY FOLLOW UP OFFICE NOTE  [name] [id number]  Assessment/Plan:   Multiple sclerosis Tension-type headache, not intractable Excessive daytime sleepiness    DMT:  Ocrevus every 8 months (low IgM) D3 2000 IU daily.   Baclofen , Lyrica  For evaluation of fatigue, check B12, TSH, free T4.  If fatigue doesn't improve with new work schedule, consider referral for sleep study. Check quantitative quantitative immunoglobulins, CBC with diff, LFTs and vit D level today and in 6 months.   Follow up 6 months.     Subjective:  [name] is a 58 year old right-handed female who follows up for multiple sclerosis.  MRI of cervical and thoracic spine personally reviewed.   UPDATE: Current DMT:  Ocrevus every 8 months (due to low IgM) Current medications:  D3 2000 IU daily, ibuprofen  600mg  PRN (for leg soreness), baclofen  15mg  TID, Lyrica  75mg  BID, Lexapro 10mg  daily  07/22/2024 LABS: B12 641; IgG 1041, IgM 11; CBC with WBC 9.7, HGB 13.8, HCT 41.3, PLT 293; Hepatic panel with t bili 0.5, ALP 60, AST 17, ALT 15; vit D 61  Vision:  No issues Motor:  *** Sensory:  Brief tingling once in while Pain:  Right leg mainly gets sore.  Gait:  Unsteady when legs hurt Bowel/Bladder:  No issues Fatigue:  Underwent workup.  TSH in March was 1.91 and B12 was 296.  Advised to take B12 1000mcg daily.   Cognition:  No issues Mood:  Improved on Lexapro.   Tension-type headache:  She does have a dull diffuse nonthrobbing headache without associated symptoms that occurs twice a month.          HISTORY:  She began having some balance problems and vision problems when she was in her early 39s.   In 2017, she began experiencing bilateral foot numbness and tingling as well as balance problems.  She saw neurology and had a NCV-EMG on 03/18/2016 which was normal.  Labs demonstrated negative ANA, sed rate 11, elevated CRP 7.4, B12 315, folate 9.2, non-reactive RPR, TSH 2.280, Hgb A1c 5.7  and low vit D 9.7.  She couldn't tolerate gabapentin   In March 2021, she began having left periorbital eye pain, not with movement, but aggravated when rubbing the eye.  She also notes blurred vision in the left eye.  She saw the eye doctor and was found only to have dry eye.     Since 2020 she has had left sided neck pain that radiates down the left arm with numbness in the fingers (particularly 3rd digit), aggravated with certain body movements.  No weakness.     Imaging: 10/10/2023 MRI C & T-SPINE W WO:  1. No change in the appearance of the cervical and thoracic spine since the prior exams. Patchy foci of cord signal abnormality are again seen in the cervical and thoracic cord consistent with the patient's history of multiple sclerosis. No new or enhancing lesion is identified. 2. Mild cervical and thoracic spondylosis without stenosis (performed due to worsening weakness in right leg without explanation on brain MRI, she had MRI of spinal cord) 07/13/2023 MRI BRAIN W WO:  1. No acute intracranial process. No evidence of acute or subacute infarct. 2. Redemonstrated findings consistent with the clinical diagnosis of multiple sclerosis. No new or enhancing lesions identified. 04/06/2023 MRI BRAIN W WO:  Findings consistent with the clinical diagnosis of multiple sclerosis. No new or enhancing lesion identified. 04/06/2023 MRI C-SPINE:  1. Multiple T2 hyperintense lesions of the cervical  and upper thoracic spinal cord, consistent with the clinical diagnosis of multiple sclerosis. Although evaluation is somewhat limited by motion, no progression of the disease is appreciated. 2. Mild degenerative changes of the cervical spine without high-grade spinal canal or neural foraminal stenosis at any level. 02/23/2023 MRI T-SPINE W WO:  1. Stable MRI appearance of the Thoracic spine since 05/20/2022. 2. Moderately advanced chronic thoracic spinal cord demyelination. No areas of progression or active  demyelination identified. 3. Upper thoracic disc degeneration without spinal stenosis. 04/30/2022 MRI BRAIN W WO:  Unchanged white matter disease consistent with multiple sclerosis.  No evidence of active demyelination. 04/30/2022 MRI C-SPINE W WO:  1. Unchanged spinal cord lesions consistent with multiple sclerosis.  No evidence of active demyelination.  2. Unchanged cervical disc and facet degeneration without compressive stenosis. 05/21/2022 MRI T-SPINE W WO:  1. Patchy multifocal cord signal abnormality throughout the thoracic spinal cord as above, consistent with history of demyelinating disease/multiple sclerosis. No evidence for active demyelination.  2. Underlying mild for age thoracic spondylosis without significant stenosis or neural impingement. 12/02/2020 MRI BRAIN W WO:  stable demyelination (more conspicuous than on 03/31/2020, but previously not performed on 3 tesla magnet).   12/02/2020 MRI C-SPINE W WO:  Stable demyelination of the cervical and upper thoracic cord compared to 03/31/2020.  No abnormal enhancement. 03/31/2020 MRI BRAIN WO:  multiple T2 hyperintense lesions involving the periventricular, subcortical and juxtacortical white matter with involvement of the callososeptal interface and brainstem, no enhancement.   02/25/2020 MRI C-SPINE W WO: multiple hyperintense lesions throughout the cervical and upper thoracic cord.     No family history of MS.  However, her mother has RA.   Past medications:  Gabapentin (caused confusion)   JC Virus antibody was positive with index of 0.4.    PAST MEDICAL HISTORY: Past Medical History:  Diagnosis Date   Depression    Heart murmur    as a child   Hypercholesteremia    Low back pain    MS (multiple sclerosis)    Neuromuscular disorder (HCC)    MS dx 04/2020   Numbness and tingling 2017   Feet   PAC (premature atrial contraction) 1998   Panic attacks     MEDICATIONS: Current Outpatient Medications on File Prior to Visit   Medication Sig Dispense Refill   ALPHA LIPOIC AC-BIOTIN-KERATIN PO Take by mouth.     baclofen  (LIORESAL ) 20 MG tablet Take 1 tablet (20 mg total) by mouth 3 (three) times daily. 90 each 5   escitalopram (LEXAPRO) 20 MG tablet Take 20 mg by mouth daily.     furosemide (LASIX) 20 MG tablet Take 20 mg by mouth every morning.     ibuprofen  (ADVIL ,MOTRIN ) 600 MG tablet Take 1 tablet (600 mg total) by mouth every 6 (six) hours as needed. 30 tablet 0   naproxen  (NAPROSYN ) 375 MG tablet Take 1 tablet (375 mg total) by mouth 2 (two) times daily as needed for moderate pain. 10 tablet 0   ocrelizumab 600 mg in sodium chloride  0.9 % 500 mL Inject 600 mg into the vein once.     pregabalin  (LYRICA ) 75 MG capsule TAKE 1 CAPSULE BY MOUTH TWICE A DAY 60 capsule 5   No current facility-administered medications on file prior to visit.    ALLERGIES: Allergies  Allergen Reactions   Shellfish-Derived Products Hives    FAMILY HISTORY: Family History  Problem Relation Age of Onset   Seizures Mother    Arthritis Mother  Heart disease Maternal Grandfather    Heart disease Maternal Grandmother    Hypertension Sister    COPD Brother    Colon cancer Neg Hx    Colon polyps Neg Hx    Esophageal cancer Neg Hx    Stomach cancer Neg Hx    Rectal cancer Neg Hx       Objective:  *** General: No acute distress.  Patient appears well-groomed.   Head:  Normocephalic/atraumatic Eyes:  Fundi examined but not visualized Neck: supple, no paraspinal tenderness, full range of motion Heart:  Regular rate and rhythm Neurological Exam: alert and oriented.  Speech fluent and not dysarthric, language intact.  CN II-XII intact. Bulk and tone normal, muscle strength 4/5 right hip flexion and ankle dorsiflexion, otherwise 5/5.  Sensation to pinprick reduced in left foot.  Vibratory sensation intact.  Deep tendon reflexes 3+ throughout, toes downgoing.  Finger to nose testing intact.  Mildly wide-based gait.  Using cane.   Romberg negative  Juliene Dunnings, DO  CC: Charlene Single, MD

## 2024-07-27 ENCOUNTER — Ambulatory Visit (INDEPENDENT_AMBULATORY_CARE_PROVIDER_SITE_OTHER): Admitting: Neurology

## 2024-07-27 ENCOUNTER — Encounter: Payer: Self-pay | Admitting: Neurology

## 2024-07-27 VITALS — BP 112/50 | HR 56 | Ht 60.0 in | Wt 191.0 lb

## 2024-07-27 DIAGNOSIS — G35D Multiple sclerosis, unspecified: Secondary | ICD-10-CM | POA: Diagnosis not present

## 2024-07-27 DIAGNOSIS — R29898 Other symptoms and signs involving the musculoskeletal system: Secondary | ICD-10-CM | POA: Diagnosis not present

## 2024-07-27 DIAGNOSIS — G44219 Episodic tension-type headache, not intractable: Secondary | ICD-10-CM | POA: Diagnosis not present

## 2024-07-27 NOTE — Patient Instructions (Addendum)
 Check MRI of brain with and without contrast and MRI of lumbar spine without contrast. We have sent a referral to [facility] for your MRI and they will call you directly to schedule your appointment. They are located at [address] [facility]. If you need to contact them directly please call [phone].  Ocrevus every 8 months-[facility] D3 2000 I U daily Baclofen  and Lyrica  IgG and vit D in  6 months. Your provider has requested that you have labwork completed today. Please go to [facility] ([id number]) on the second floor of this building before leaving the office today. You do not need to check in. If you are not called within 15 minutes please check with the front desk.   Follow up 6 months (after labs)

## 2024-07-28 ENCOUNTER — Telehealth: Payer: Self-pay

## 2024-07-28 DIAGNOSIS — L600 Ingrowing nail: Secondary | ICD-10-CM

## 2024-07-28 NOTE — Telephone Encounter (Signed)
 Amb referral podiatry.

## 2024-08-03 ENCOUNTER — Ambulatory Visit (INDEPENDENT_AMBULATORY_CARE_PROVIDER_SITE_OTHER): Admitting: Podiatry

## 2024-08-03 ENCOUNTER — Encounter: Payer: Self-pay | Admitting: Podiatry

## 2024-08-03 DIAGNOSIS — G35D Multiple sclerosis, unspecified: Secondary | ICD-10-CM

## 2024-08-03 DIAGNOSIS — B351 Tinea unguium: Secondary | ICD-10-CM

## 2024-08-03 DIAGNOSIS — M79674 Pain in right toe(s): Secondary | ICD-10-CM

## 2024-08-03 DIAGNOSIS — L600 Ingrowing nail: Secondary | ICD-10-CM

## 2024-08-03 DIAGNOSIS — M79675 Pain in left toe(s): Secondary | ICD-10-CM | POA: Diagnosis not present

## 2024-08-03 NOTE — Patient Instructions (Addendum)

## 2024-08-04 NOTE — Progress Notes (Signed)
 Subjective:   Patient ID: [name], female   DOB: 58 y.o.   MRN: [id number]   HPI Patient presents stating she has a severely damaged right big toenail that she cannot take care of and it is getting increasingly sore.  Also has a lesion underneath the right foot and tried callus remover and patient does have multiple sclerosis without a good gait pattern.  Patient does not smoke likes to be active   Review of Systems  All other systems reviewed and are negative.       Objective:  Physical Exam Vitals and nursing note reviewed.  Constitutional:      Appearance: She is well-developed.  Pulmonary:     Effort: Pulmonary effort is normal.  Musculoskeletal:        General: Normal range of motion.  Skin:    General: Skin is warm.  Neurological:     Mental Status: She is alert.     Neurovascular status intact muscle strength was found to be adequate range of motion adequate with severely thickened deformed right hallux nail bed painful when pressed inability to wear shoe gear comfortably with looseness of the bed overall.  Good digital perfusion well-oriented x 3 with patient found to have moderate weakness of the extensors flexors secondary to multiple sclerosis with lesion subfirst right probably due to gait structure     Assessment:  Chronic severely deformed right big toenail lesion formation right multiple sclerosis with moderate muscle strength loss     Plan:  H&P reviewed all conditions discussed with the patient.  At this point I went ahead and I have recommended removal of the hallux nail due to the severe deformity patient wants this done I explained procedure risk and patient wants surgery.  She signed consent form I infiltrated the right big toe 60 mg like Marcaine mixture sterile prep using sterile instrumentation remove the nail exposed matrix applied phenol 5 applications 30 seconds followed by alcohol lavage sterile dressing gave instructions on soaks wear  dressing 24 hours take it off earlier if throbbing were to occur and encouraged her to call with questions concerns which may arise.  Debrided plantar lesion courtesy discussed orthotics discussed home muscle strengthening activities

## 2024-08-21 ENCOUNTER — Ambulatory Visit
Admission: RE | Admit: 2024-08-21 | Discharge: 2024-08-21 | Disposition: A | Source: Ambulatory Visit | Attending: Neurology

## 2024-08-21 ENCOUNTER — Ambulatory Visit
Admission: RE | Admit: 2024-08-21 | Discharge: 2024-08-21 | Disposition: A | Discharge status: Home / Self Care | Source: Ambulatory Visit | Attending: Neurology | Admitting: Neurology

## 2024-08-21 DIAGNOSIS — G35D Multiple sclerosis, unspecified: Secondary | ICD-10-CM

## 2024-08-21 DIAGNOSIS — R29898 Other symptoms and signs involving the musculoskeletal system: Secondary | ICD-10-CM

## 2024-08-21 MED ORDER — GADOPICLENOL 0.5 MMOL/ML IV SOLN
7.5000 mL | Freq: Once | INTRAVENOUS | Status: AC | PRN
  Administered 2024-08-21: 7.5 mL via INTRAVENOUS

## 2024-08-23 ENCOUNTER — Ambulatory Visit: Payer: Self-pay | Admitting: Neurology

## 2024-08-24 NOTE — Telephone Encounter (Signed)
 MRI Lumbar results given. MRI Brain patient advised still pending.

## 2024-08-26 NOTE — Progress Notes (Signed)
 Patient advised.

## 2024-09-09 NOTE — Progress Notes (Deleted)
 [name] single  Medical Nutrition Therapy  Appointment Start time:  ***  Appointment End time:  ***  Primary concerns today: ***  Referral diagnosis: Morbid (severe) obesity due to excess calories Preferred learning style: *** (auditory, visual, hands on, no preference indicated) Learning readiness: *** (not ready, contemplating, ready, change in progress)   NUTRITION ASSESSMENT    Clinical Medical Hx: *** Medications:  Current Outpatient Medications:    ALPHA LIPOIC AC-BIOTIN-KERATIN PO, Take by mouth., Disp: , Rfl:    baclofen  (LIORESAL ) 20 MG tablet, Take 1 tablet (20 mg total) by mouth 3 (three) times daily., Disp: 90 each, Rfl: 5   escitalopram (LEXAPRO) 20 MG tablet, Take 20 mg by mouth daily., Disp: , Rfl:    furosemide (LASIX) 20 MG tablet, Take 20 mg by mouth every morning., Disp: , Rfl:    ibuprofen  (ADVIL ,MOTRIN ) 600 MG tablet, Take 1 tablet (600 mg total) by mouth every 6 (six) hours as needed., Disp: 30 tablet, Rfl: 0   naproxen  (NAPROSYN ) 375 MG tablet, Take 1 tablet (375 mg total) by mouth 2 (two) times daily as needed for moderate pain., Disp: 10 tablet, Rfl: 0   ocrelizumab 600 mg in sodium chloride  0.9 % 500 mL, Inject 600 mg into the vein once., Disp: , Rfl:    pregabalin  (LYRICA ) 75 MG capsule, TAKE 1 CAPSULE BY MOUTH TWICE A DAY, Disp: 60 capsule, Rfl: 5 Wegovy***Zepbound Labs:  Lab Results  Component Value Date   HGBA1C 5.7 (H) 03/18/2016    Notable Signs/Symptoms:  Wt Readings from Last 3 Encounters:  07/27/24 191 lb (86.6 kg)  01/20/24 191 lb 12.8 oz (87 kg)  08/26/23 190 lb 12.8 oz (86.5 kg)     Lifestyle & Dietary Hx Pt present today ***. Pt reports a desire to decrease body weight to a lower BMI range. Pt reports *** does the cooking and shopping. Pt reports eating out *** times ***. Pt reports attempting the following changes ***.   All Pt's questions were answered during this encounter.   Estimated daily fluid intake: *** oz Supplements:  *** Sleep: *** Stress / self-care: *** Current average weekly physical activity: ***  24-Hr Dietary Recall First Meal: *** Snack: *** Second Meal: *** Snack: *** Third Meal: *** Snack: *** Beverages: ***  Estimated Energy Needs Calories: *** Carbohydrate: ***g Protein: ***g Fat: ***g   NUTRITION DIAGNOSIS  {CHL AMB NUTRITIONAL DIAGNOSIS:[id number]}   NUTRITION INTERVENTION  Nutrition education (E-1) on the following topics:  ***  Handouts Provided Include  ***  Learning Style & Readiness for Change Teaching method utilized: Visual & Auditory  Demonstrated degree of understanding via: Teach Back  Barriers to learning/adherence to lifestyle change: ***  Goals Established by Pt ***   MONITORING & EVALUATION Dietary intake, weekly physical activity, and *** in ***.  Next Steps  Patient is to ***.

## 2024-09-16 ENCOUNTER — Encounter: Admitting: Dietician

## 2024-09-19 NOTE — Progress Notes (Unsigned)
 [name] single  Medical Nutrition Therapy  Appointment Start time:  ***  Appointment End time:  ***  Primary concerns today: ***  Referral diagnosis: Morbid (severe) obesity due to excess calories Preferred learning style: *** (auditory, visual, hands on, no preference indicated) Learning readiness: *** (not ready, contemplating, ready, change in progress)   NUTRITION ASSESSMENT    Clinical Medical Hx: *** Medications:  Current Outpatient Medications:    ALPHA LIPOIC AC-BIOTIN-KERATIN PO, Take by mouth., Disp: , Rfl:    baclofen  (LIORESAL ) 20 MG tablet, Take 1 tablet (20 mg total) by mouth 3 (three) times daily., Disp: 90 each, Rfl: 5   escitalopram (LEXAPRO) 20 MG tablet, Take 20 mg by mouth daily., Disp: , Rfl:    furosemide (LASIX) 20 MG tablet, Take 20 mg by mouth every morning., Disp: , Rfl:    ibuprofen  (ADVIL ,MOTRIN ) 600 MG tablet, Take 1 tablet (600 mg total) by mouth every 6 (six) hours as needed., Disp: 30 tablet, Rfl: 0   naproxen  (NAPROSYN ) 375 MG tablet, Take 1 tablet (375 mg total) by mouth 2 (two) times daily as needed for moderate pain., Disp: 10 tablet, Rfl: 0   ocrelizumab 600 mg in sodium chloride  0.9 % 500 mL, Inject 600 mg into the vein once., Disp: , Rfl:    pregabalin  (LYRICA ) 75 MG capsule, TAKE 1 CAPSULE BY MOUTH TWICE A DAY, Disp: 60 capsule, Rfl: 5 Wegovy***Zepbound Labs:  Lab Results  Component Value Date   HGBA1C 5.7 (H) 03/18/2016    Notable Signs/Symptoms:  Wt Readings from Last 3 Encounters:  07/27/24 191 lb (86.6 kg)  01/20/24 191 lb 12.8 oz (87 kg)  08/26/23 190 lb 12.8 oz (86.5 kg)     Lifestyle & Dietary Hx Pt present today ***. Pt reports a desire to decrease body weight to a lower BMI range. Pt reports *** does the cooking and shopping. Pt reports eating out *** times ***. Pt reports attempting the following changes ***.   All Pt's questions were answered during this encounter.   Estimated daily fluid intake: *** oz Supplements:  *** Sleep: *** Stress / self-care: *** Current average weekly physical activity: ***  24-Hr Dietary Recall First Meal: *** Snack: *** Second Meal: *** Snack: *** Third Meal: *** Snack: *** Beverages: ***  Estimated Energy Needs Calories: *** Carbohydrate: ***g Protein: ***g Fat: ***g   NUTRITION DIAGNOSIS  {CHL AMB NUTRITIONAL DIAGNOSIS:[id number]}   NUTRITION INTERVENTION  Nutrition education (E-1) on the following topics:  ***  Handouts Provided Include  ***  Learning Style & Readiness for Change Teaching method utilized: Visual & Auditory  Demonstrated degree of understanding via: Teach Back  Barriers to learning/adherence to lifestyle change: ***  Goals Established by Pt ***   MONITORING & EVALUATION Dietary intake, weekly physical activity, and *** in ***.  Next Steps  Patient is to ***.  fried chicken, green beans, dr pepper or sweet tea   Snack: apple Beverages: water, half sweet tea mixed with unsweet  tea, dr pepper, lemondae  NUTRITION DIAGNOSIS  NB-1.1 Food and nutrition-related knowledge deficit As related to no prior nutrition related education .  As evidenced by Pt reports and dietary recall.  NUTRITION INTERVENTION  Nutrition education (E-1) on the following topics:  Fruits & Vegetables: Aim to fill half your plate with a variety of fruits and vegetables. They are rich in vitamins, minerals, and fiber, and can help reduce the risk of chronic diseases. Choose a colorful  assortment of fruits and vegetables to ensure you get a wide range of nutrients. Grains and Starches: Make at least half of your grain choices whole grains, such as brown rice, whole wheat bread, and oats. Whole grains provide fiber, which aids in digestion and healthy cholesterol levels. Aim for whole forms of starchy vegetables such as potatoes, sweet potatoes, beans, peas, and corn, which are fiber rich and provide many vitamins and minerals.  Protein: Incorporate lean sources of protein, such as poultry, fish, beans, nuts, and seeds, into your meals. Protein is essential for building and repairing tissues, staying full, balancing blood sugar, as well as supporting immune function. Dairy: Include low-fat or fat-free dairy products like milk, yogurt, and cheese in your diet. Dairy foods are excellent sources of calcium and vitamin D , which are crucial for bone health.  Physical Activity: Aim for 60 minutes of physical activity daily. Regular physical activity promotes overall health-including helping to reduce risk for heart disease and diabetes, promoting mental health, and helping us  sleep better.    Handouts Provided Include  Move your way- [employer]HS Plate Planner- [employer]  Learning Style & Readiness for Change Teaching method utilized: Visual & Auditory  Demonstrated degree of understanding via: Teach Back  Barriers to learning/adherence to lifestyle change: work schedule, time management  Goals Established by Pt Decrease regular sweetened beverages to 12 ounces or less daily  Try to limit caffeine after 12 noon with first shift work   MONITORING & EVALUATION Dietary intake, weekly physical activity  Next Steps  Patient is to return 10/28/2024

## 2024-09-21 ENCOUNTER — Encounter: Attending: Family Medicine | Admitting: Dietician

## 2024-09-28 ENCOUNTER — Other Ambulatory Visit: Payer: Self-pay | Admitting: Neurology

## 2024-10-21 NOTE — Progress Notes (Deleted)
 Medical Nutrition Therapy  Appointment Start time:  1622  Appointment End time:  1725  Primary concerns today: decrease body weight to a lower BMI range  Referral diagnosis: Morbid (severe) obesity due to excess calories Preferred learning style:  no preference indicated Learning readiness:  ready-change in progress   NUTRITION ASSESSMENT  Clinical Medical Hx:  Past Medical History:  Diagnosis Date   Depression    Heart murmur    as a child   Hypercholesteremia    Low back pain    MS (multiple sclerosis)    Neuromuscular disorder (HCC)    MS dx 04/2020   Numbness and tingling 2017   Feet   PAC (premature atrial contraction) 1998   Panic attacks     Medications:  Current Outpatient Medications:    ALPHA LIPOIC AC-BIOTIN-KERATIN PO, Take by mouth., Disp: , Rfl:    baclofen  (LIORESAL ) 20 MG tablet, Take 1 tablet (20 mg total) by mouth 3 (three) times daily., Disp: 90 each, Rfl: 5   escitalopram (LEXAPRO) 20 MG tablet, Take 20 mg by mouth daily., Disp: , Rfl:    furosemide (LASIX) 20 MG tablet, Take 20 mg by mouth every morning., Disp: , Rfl:    ibuprofen  (ADVIL ,MOTRIN ) 600 MG tablet, Take 1 tablet (600 mg total) by mouth every 6 (six) hours as needed., Disp: 30 tablet, Rfl: 0   naproxen  (NAPROSYN ) 375 MG tablet, Take 1 tablet (375 mg total) by mouth 2 (two) times daily as needed for moderate pain., Disp: 10 tablet, Rfl: 0   ocrelizumab 600 mg in sodium chloride  0.9 % 500 mL, Inject 600 mg into the vein once. (Patient not taking: Reported on 09/21/2024), Disp: , Rfl:    pregabalin  (LYRICA ) 75 MG capsule, TAKE 1 CAPSULE BY MOUTH TWICE A DAY, Disp: 60 capsule, Rfl: 5  Labs:  Lab Results  Component Value Date   HGBA1C 5.7 (H) 03/18/2016   Notable Signs/Symptoms:  Wt Readings from Last 3 Encounters:  07/27/24 191 lb (86.6 kg)  01/20/24 191 lb 12.8 oz (87 kg)  08/26/23 190 lb 12.8 oz (86.5 kg)  Weight: 189 lbs, 12.8 oz per referring provider obtained 09/06/2024  Lifestyle &  Dietary Hx     11/26:Pt presents today alone with a cane. 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 Pt reports a desire to decrease body weight to a lower BMI range. Pt reports shared shopping with her adult son and Pt does the majority of the cooking. Pt reports eating out five times weekly. Pt reports attempting the following change to avoid sugary sweetened beverage and states intake is daily. Pt reports successful increasing vegetables, fruits and vegetables. Pt reports is appetite is well controlled. Pt reports weekly GLP was not covered by insurance.  Pt c/o day time sleepiness and states her son reports snoring-RD encouraged to dicuss sleep further with PCP. All Pt's questions were answered during this encounter.   Estimated daily fluid intake: unknown oz Supplements: MVI, vitamin D3, lions mane Sleep: 6 hours nightly  Stress / self-care: 3 out of 10 / self care includes: read, prayer Current average weekly physical activity: walk  24-Hr Dietary Recall First Meal: 2 tbsp of plain greek yogurt, berries or avocado or 1 banana, coffee palin or with added sugar and creamer  Snack: none or chips Second Meal: 1 slice of pizza, salad with dressing or chicken tender with salad, half sweet tea mixed with unsweet or dr pepper or lemondae  Snack: none Third Meal: fried chicken, green beans, dr pepper or sweet tea   Snack: apple Beverages: water, half sweet tea mixed with unsweet  tea, dr pepper, lemondae  NUTRITION DIAGNOSIS  NB-1.1 Food and nutrition-related knowledge deficit As related to no prior nutrition related education .  As evidenced by Pt reports and dietary recall.  NUTRITION INTERVENTION  Nutrition education (E-1) on the following topics:  Fruits & Vegetables: Aim to fill half your plate with a variety of fruits and vegetables. They are rich in vitamins, minerals, and fiber, and can help reduce the risk of chronic diseases. Choose a  colorful assortment of fruits and vegetables to ensure you get a wide range of nutrients. Grains and Starches: Make at least half of your grain choices whole grains, such as brown rice, whole wheat bread, and oats. Whole grains provide fiber, which aids in digestion and healthy cholesterol levels. Aim for whole forms of starchy vegetables such as potatoes, sweet potatoes, beans, peas, and corn, which are fiber rich and provide many vitamins and minerals.  Protein: Incorporate lean sources of protein, such as poultry, fish, beans, nuts, and seeds, into your meals. Protein is essential for building and repairing tissues, staying full, balancing blood sugar, as well as supporting immune function. Dairy: Include low-fat or fat-free dairy products like milk, yogurt, and cheese in your diet. Dairy foods are excellent sources of calcium and vitamin D , which are crucial for bone health.  Physical Activity: Aim for 60 minutes of physical activity daily. Regular physical activity promotes overall health-including helping to reduce risk for heart disease and diabetes, promoting mental health, and helping us  sleep better.    Handouts Provided Include  Move your way- [employer] Plate Planner- [employer]  Learning Style & Readiness for Change Teaching method utilized: Visual & Auditory  Demonstrated degree of understanding via: Teach Back  Barriers to learning/adherence to lifestyle change: work schedule, time management  Goals Established by Pt Decrease regular sweetened beverages to 12 ounces or less daily  Try to limit caffeine after 12 noon with first shift work   MONITORING & EVALUATION Dietary intake, weekly physical activity  Next Steps  Patient is to return 10/28/2024

## 2024-10-28 ENCOUNTER — Encounter: Admitting: Dietician

## 2024-11-07 ENCOUNTER — Telehealth: Payer: Self-pay | Admitting: Neurology

## 2024-11-07 NOTE — Telephone Encounter (Signed)
 Patient  advised Dr.Jaffe note, She should go to the ED immediately

## 2024-11-07 NOTE — Telephone Encounter (Signed)
 Pt called in this morning and she stated that she is experiencing some numbness in her right side. Thanks

## 2024-11-07 NOTE — Telephone Encounter (Signed)
 Patient complaining of Numbness on her right side of her body on Saturday morning with labored breathing.  Called EMG, was Evaluated . No need to go to the ED.   Per patient she had some numbness and unable to move her right leg on Sunday. Her feet and leg are swollen.   Please advise.

## 2024-11-10 ENCOUNTER — Encounter (HOSPITAL_BASED_OUTPATIENT_CLINIC_OR_DEPARTMENT_OTHER): Payer: Self-pay | Admitting: Family Medicine

## 2024-11-10 ENCOUNTER — Other Ambulatory Visit: Payer: Self-pay | Admitting: Family Medicine

## 2024-11-10 DIAGNOSIS — R600 Localized edema: Secondary | ICD-10-CM

## 2024-11-11 ENCOUNTER — Ambulatory Visit (HOSPITAL_BASED_OUTPATIENT_CLINIC_OR_DEPARTMENT_OTHER)
Admission: RE | Admit: 2024-11-11 | Discharge: 2024-11-11 | Disposition: A | Discharge status: Home / Self Care | Source: Ambulatory Visit | Attending: Family Medicine | Admitting: Family Medicine

## 2024-11-11 DIAGNOSIS — R600 Localized edema: Secondary | ICD-10-CM | POA: Diagnosis not present

## 2025-02-16 ENCOUNTER — Ambulatory Visit: Admitting: Neurology
